# Patient Record
Sex: Female | Born: 1980 | Hispanic: Yes | Marital: Single | Smoking: Never smoker
Health system: Southern US, Community
[De-identification: ages and names within clinical notes are randomized; demographics above are authoritative.]

## PROBLEM LIST (undated history)

## (undated) DIAGNOSIS — Z8719 Personal history of other diseases of the digestive system: Secondary | ICD-10-CM

## (undated) DIAGNOSIS — G43909 Migraine, unspecified, not intractable, without status migrainosus: Secondary | ICD-10-CM

## (undated) DIAGNOSIS — K029 Dental caries, unspecified: Secondary | ICD-10-CM

## (undated) DIAGNOSIS — I10 Essential (primary) hypertension: Secondary | ICD-10-CM

## (undated) DIAGNOSIS — I471 Supraventricular tachycardia, unspecified: Secondary | ICD-10-CM

## (undated) DIAGNOSIS — Z8632 Personal history of gestational diabetes: Secondary | ICD-10-CM

## (undated) DIAGNOSIS — I82409 Acute embolism and thrombosis of unspecified deep veins of unspecified lower extremity: Secondary | ICD-10-CM

## (undated) HISTORY — DX: Supraventricular tachycardia: I47.1

## (undated) HISTORY — DX: Personal history of other diseases of the digestive system: Z87.19

## (undated) HISTORY — DX: Acute embolism and thrombosis of unspecified deep veins of unspecified lower extremity: I82.409

## (undated) HISTORY — DX: Migraine, unspecified, not intractable, without status migrainosus: G43.909

## (undated) HISTORY — DX: Dental caries, unspecified: K02.9

## (undated) HISTORY — DX: Supraventricular tachycardia, unspecified: I47.10

## (undated) HISTORY — DX: Personal history of gestational diabetes: Z86.32

## (undated) HISTORY — PX: OTHER SURGICAL HISTORY: SHX169

## (undated) HISTORY — DX: Essential (primary) hypertension: I10

---

## 2004-05-15 ENCOUNTER — Ambulatory Visit: Payer: Self-pay | Admitting: Internal Medicine

## 2004-11-07 ENCOUNTER — Emergency Department (HOSPITAL_COMMUNITY): Admission: EM | Admit: 2004-11-07 | Discharge: 2004-11-07 | Payer: Self-pay | Admitting: Emergency Medicine

## 2005-04-24 ENCOUNTER — Observation Stay: Payer: Self-pay

## 2005-04-28 ENCOUNTER — Observation Stay: Payer: Self-pay

## 2005-04-30 ENCOUNTER — Observation Stay: Payer: Self-pay

## 2005-05-01 ENCOUNTER — Inpatient Hospital Stay: Payer: Self-pay

## 2006-10-23 ENCOUNTER — Inpatient Hospital Stay: Payer: Self-pay | Admitting: Obstetrics and Gynecology

## 2006-11-06 ENCOUNTER — Ambulatory Visit: Payer: Self-pay | Admitting: Obstetrics and Gynecology

## 2009-11-08 ENCOUNTER — Emergency Department (HOSPITAL_COMMUNITY): Admission: EM | Admit: 2009-11-08 | Discharge: 2009-11-08 | Payer: Self-pay | Admitting: Emergency Medicine

## 2010-10-04 ENCOUNTER — Emergency Department (HOSPITAL_COMMUNITY)
Admission: EM | Admit: 2010-10-04 | Discharge: 2010-10-04 | Payer: Self-pay | Source: Home / Self Care | Admitting: Emergency Medicine

## 2010-10-04 LAB — URINALYSIS, ROUTINE W REFLEX MICROSCOPIC
Bilirubin Urine: NEGATIVE
Nitrite: NEGATIVE
Protein, ur: NEGATIVE mg/dL
Specific Gravity, Urine: 1.01 (ref 1.005–1.030)
Urobilinogen, UA: 0.2 mg/dL (ref 0.0–1.0)

## 2010-10-04 LAB — BASIC METABOLIC PANEL
Chloride: 105 mEq/L (ref 96–112)
GFR calc non Af Amer: >60 mL/min (ref >60)
Glucose, Bld: 90 mg/dL (ref 70–99)

## 2010-10-04 LAB — DIFFERENTIAL
Basophils Relative: 0 % (ref 0–1)
Eosinophils Absolute: 0.1 10*3/uL (ref 0.0–0.7)
Eosinophils Relative: 1 % (ref 0–5)
Monocytes Absolute: 0.6 10*3/uL (ref 0.1–1.0)

## 2010-10-04 LAB — CBC
HCT: 36.7 % (ref 36.0–46.0)
Hemoglobin: 12.3 g/dL (ref 12.0–15.0)
MCH: 30.4 pg (ref 26.0–34.0)
MCHC: 33.5 g/dL (ref 30.0–36.0)
RBC: 4.04 MIL/uL (ref 3.87–5.11)

## 2010-10-04 LAB — WET PREP, GENITAL: Yeast Wet Prep HPF POC: NONE SEEN

## 2010-10-04 LAB — POCT PREGNANCY, URINE: Preg Test, Ur: POSITIVE

## 2010-10-04 LAB — URINE MICROSCOPIC-ADD ON

## 2010-10-04 LAB — ABO/RH: ABO/RH(D): O POS

## 2010-10-04 LAB — HCG, QUANTITATIVE, PREGNANCY: hCG, Beta Chain, Quant, S: 5529 m[IU]/mL — ABNORMAL HIGH (ref <5)

## 2010-10-05 ENCOUNTER — Inpatient Hospital Stay (HOSPITAL_COMMUNITY)
Admission: AD | Admit: 2010-10-05 | Discharge: 2010-10-05 | Payer: Self-pay | Source: Home / Self Care | Attending: Obstetrics & Gynecology | Admitting: Obstetrics & Gynecology

## 2010-10-06 ENCOUNTER — Inpatient Hospital Stay (HOSPITAL_COMMUNITY)
Admission: AD | Admit: 2010-10-06 | Discharge: 2010-10-06 | Payer: Self-pay | Source: Home / Self Care | Attending: Obstetrics & Gynecology | Admitting: Obstetrics & Gynecology

## 2010-10-06 LAB — GC/CHLAMYDIA PROBE AMP, GENITAL: GC Probe Amp, Genital: NEGATIVE

## 2010-10-23 ENCOUNTER — Encounter: Payer: Self-pay | Admitting: Obstetrics and Gynecology

## 2010-12-01 LAB — URINALYSIS, ROUTINE W REFLEX MICROSCOPIC
Bilirubin Urine: NEGATIVE
Glucose, UA: NEGATIVE mg/dL
Hgb urine dipstick: NEGATIVE
Specific Gravity, Urine: 1.013 (ref 1.005–1.030)
pH: 7.5 (ref 5.0–8.0)

## 2010-12-01 LAB — URINE MICROSCOPIC-ADD ON

## 2011-08-03 ENCOUNTER — Encounter: Payer: Self-pay | Admitting: Maternal and Fetal Medicine

## 2011-09-07 ENCOUNTER — Ambulatory Visit: Payer: Self-pay | Admitting: Advanced Practice Midwife

## 2011-09-08 NOTE — L&D Delivery Note (Signed)
Delivery Note At 2:39 AM a viable and healthy female was delivered via Vaginal, Spontaneous Delivery (Presentation: ;  ).  APGAR: 3, 9; weight 8 lb 1.8 oz (3680 g).   Placenta status: Intact, Spontaneous.  Cord: 3 vessels with the following complications: .  Cord pH: 7.20  Anesthesia: None  Episiotomy: None Lacerations: 2nd degree;Perineal Suture Repair: 2.0 Est. Blood Loss (mL): 450  Code Apgar Called, positive pressure ventilation X 45 seconds.  Improved.  NICU arrived and attended to baby   Mom to postpartum.  Baby to nursery-stable.  [name], Joy 02/19/2012, 3:28 AM    I precepted this delivery.  I agree with the above note.  Levie Heritage, DO 02/19/2012 4:49 AM

## 2012-01-25 IMAGING — US US OB NUCHAL TRANSLUCENCY 1ST GEST - [facility] NRPT
1 series · 14 of 28 positions shown · non-contrast
Comparison: none

[Series 1: us ob nuchal translucency 1st gest - [facility] nrpt · 14 of 38 slices shown]
[im 2/38]
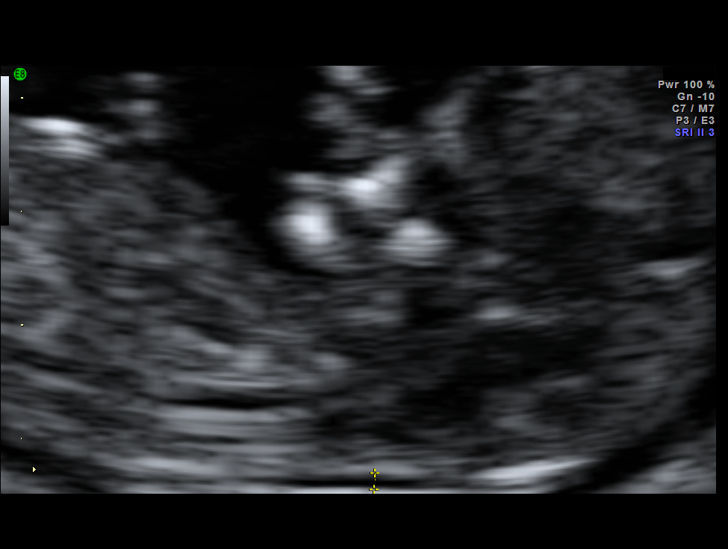
[im 5/38]
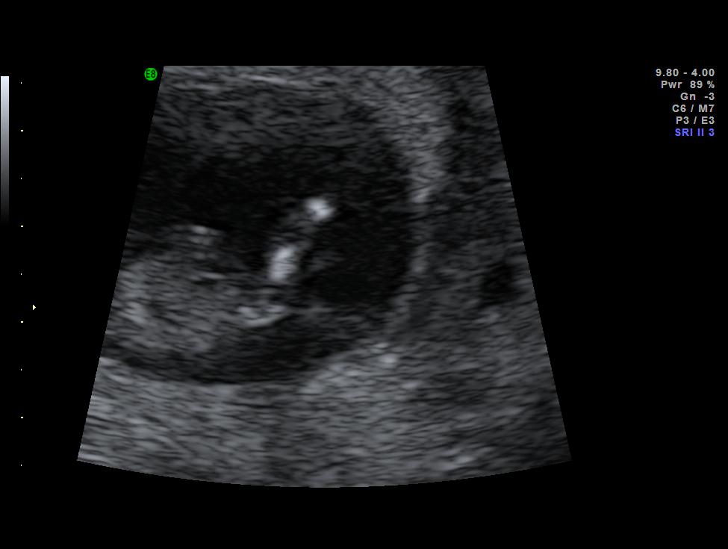
[im 7/38]
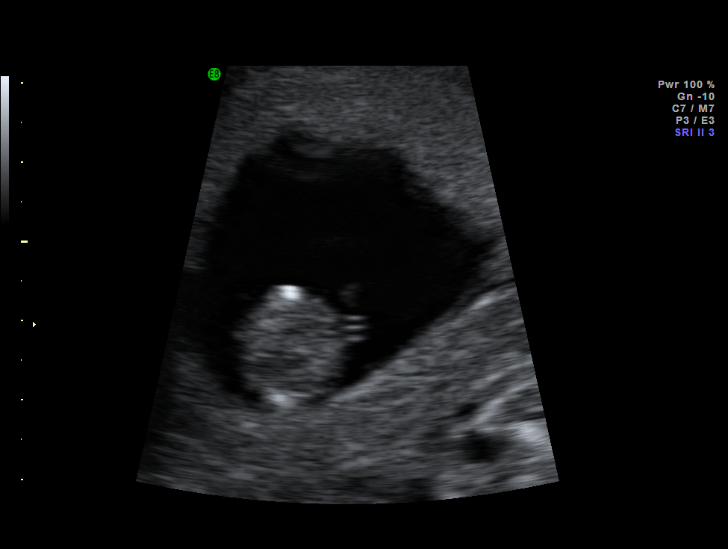
[im 10/38]
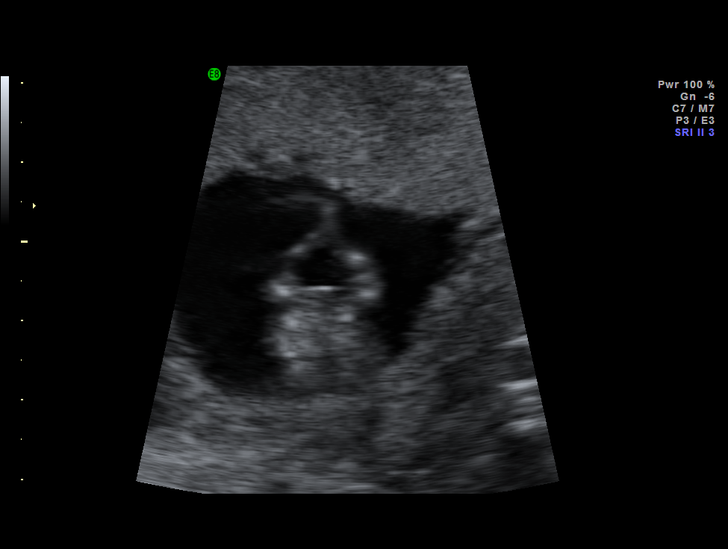
[im 13/38]
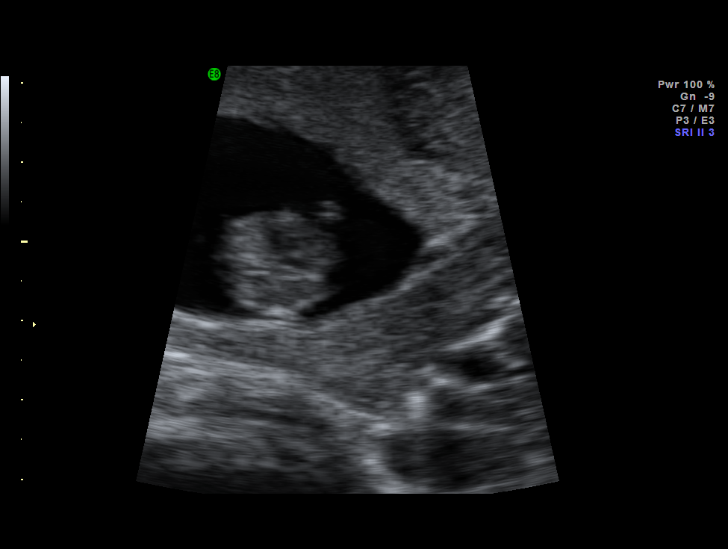
[im 16/38]
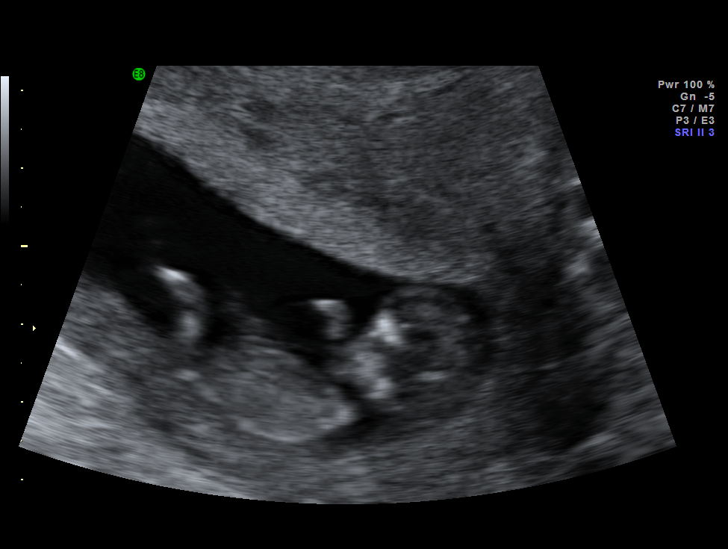
[im 18/38]
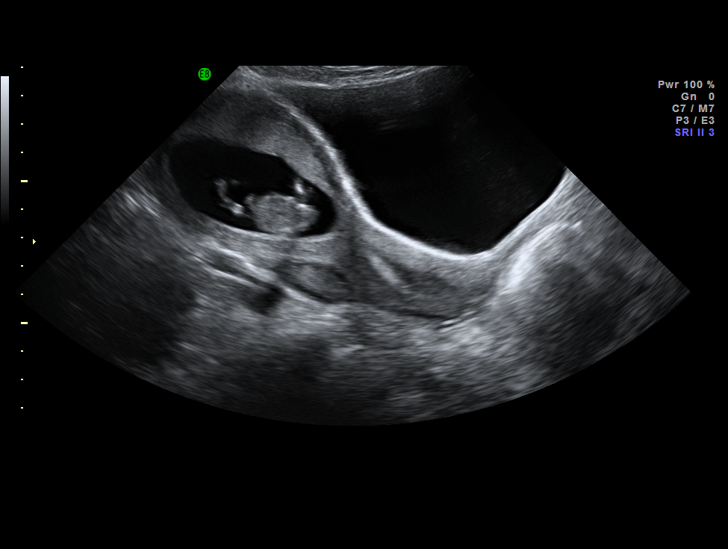
[im 21/38]
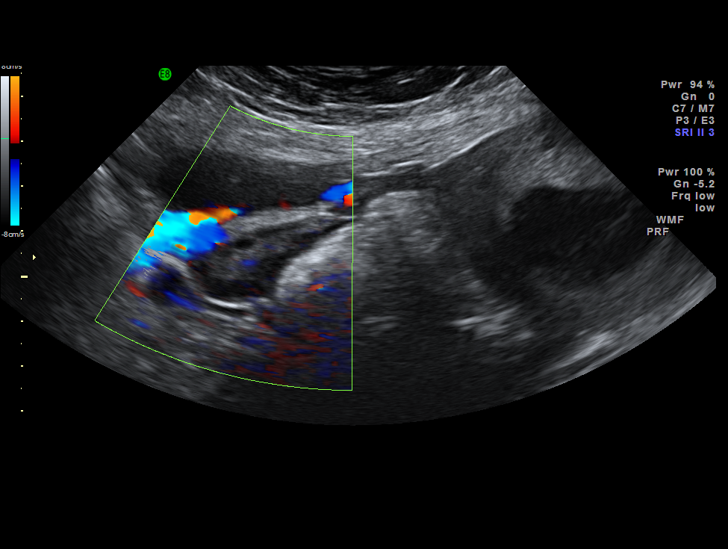
[im 24/38]
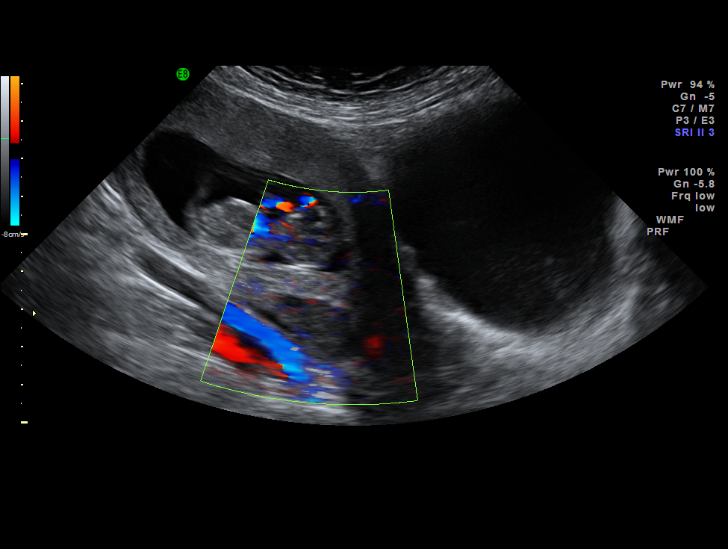
[im 27/38]
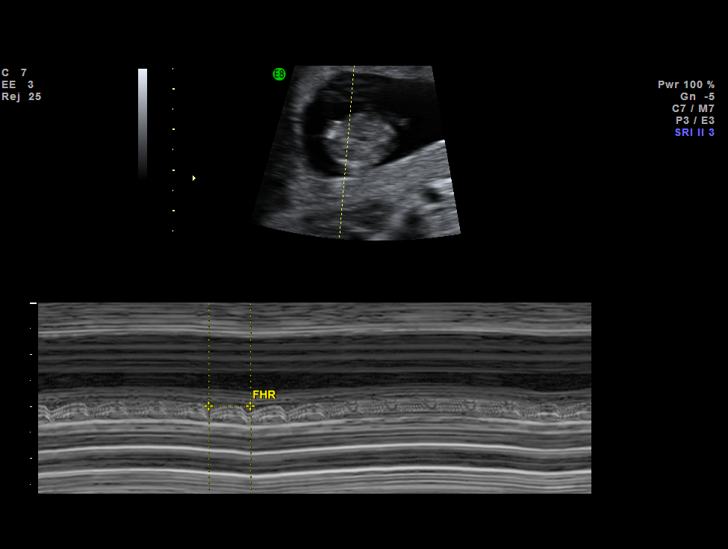
[im 29/38]
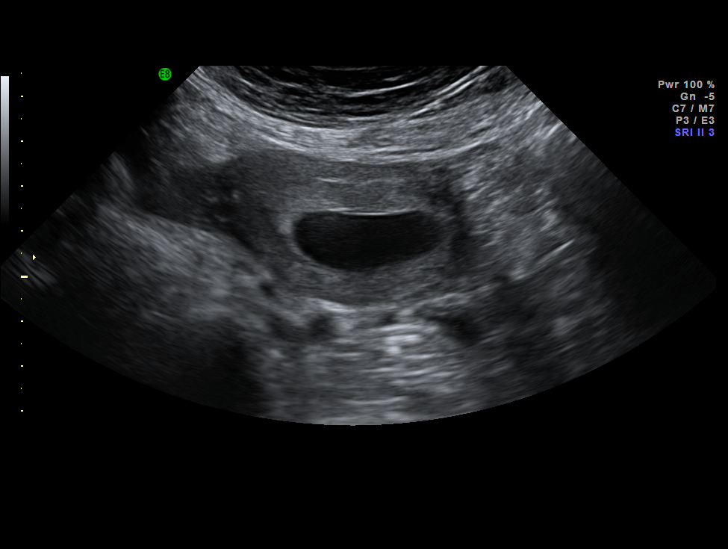
[im 32/38]
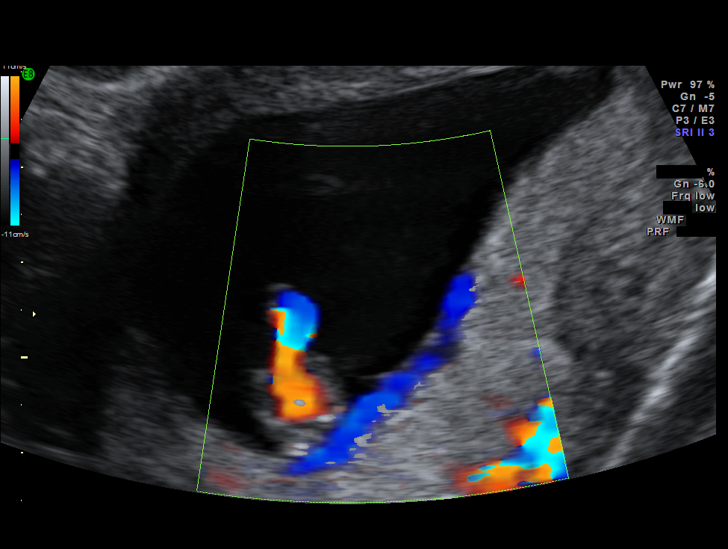
[im 35/38]
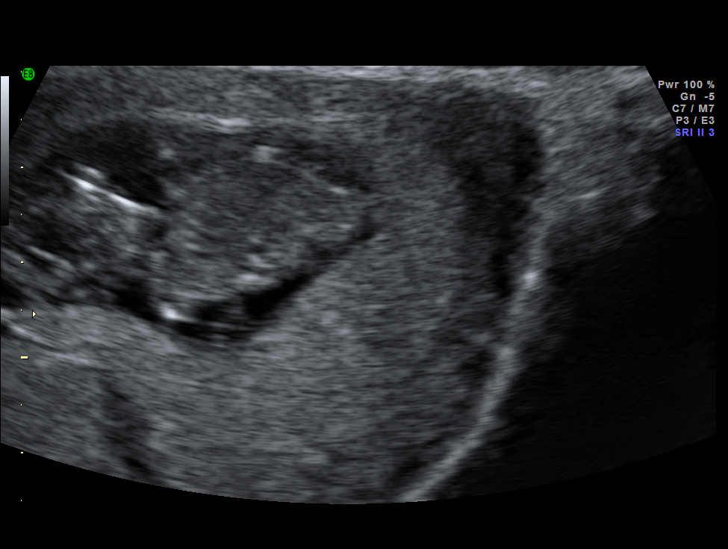
[im 38/38]
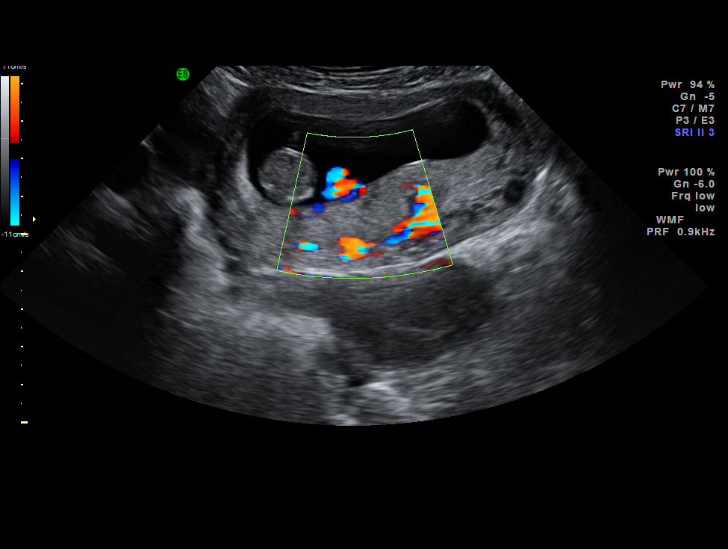

[14 of 28 positions shown; findings below may reference images not displayed]

IMAGES IMPORTED FROM THE [employer]NGO WORKFLOW SYSTEM
NO DICTATION FOR STUDY

## 2012-02-18 ENCOUNTER — Encounter (HOSPITAL_COMMUNITY): Payer: Self-pay

## 2012-02-18 ENCOUNTER — Inpatient Hospital Stay (HOSPITAL_COMMUNITY)
Admission: AD | Admit: 2012-02-18 | Discharge: 2012-02-21 | DRG: 775 | Disposition: A | Discharge status: Home / Self Care | Payer: Medicaid Other | Source: Ambulatory Visit | Attending: Obstetrics & Gynecology | Admitting: Obstetrics & Gynecology

## 2012-02-18 DIAGNOSIS — Z22330 Carrier of Group B streptococcus: Secondary | ICD-10-CM

## 2012-02-18 DIAGNOSIS — O4290 Premature rupture of membranes, unspecified as to length of time between rupture and onset of labor, unspecified weeks of gestation: Secondary | ICD-10-CM | POA: Diagnosis present

## 2012-02-18 DIAGNOSIS — O99892 Other specified diseases and conditions complicating childbirth: Secondary | ICD-10-CM | POA: Diagnosis present

## 2012-02-18 LAB — RUBELLA SCREEN: Rubella: 61.6 IU/mL — ABNORMAL HIGH

## 2012-02-18 LAB — CBC
HCT: 35.8 % — ABNORMAL LOW (ref 36.0–46.0)
Hemoglobin: 12.1 g/dL (ref 12.0–15.0)
MCH: 31.8 pg (ref 26.0–34.0)
MCHC: 33.8 g/dL (ref 30.0–36.0)
RDW: 14.4 % (ref 11.5–15.5)

## 2012-02-18 LAB — ABO/RH: ABO/RH(D): O POS

## 2012-02-18 MED ORDER — PENICILLIN G POTASSIUM 5000000 UNITS IJ SOLR
2.5000 10*6.[IU] | INTRAVENOUS | Status: DC
  Administered 2012-02-18 (×2): 2.5 10*6.[IU] via INTRAVENOUS
  Filled 2012-02-18 (×7): qty 2.5

## 2012-02-18 MED ORDER — OXYTOCIN 20 UNITS IN LACTATED RINGERS INFUSION - SIMPLE
125.0000 mL/h | Freq: Once | INTRAVENOUS | Status: AC
  Administered 2012-02-19: 125 mL/h via INTRAVENOUS

## 2012-02-18 MED ORDER — OXYTOCIN BOLUS FROM INFUSION
500.0000 mL | Freq: Once | INTRAVENOUS | Status: DC
  Filled 2012-02-18: qty 500

## 2012-02-18 MED ORDER — MISOPROSTOL 25 MCG QUARTER TABLET
25.0000 ug | ORAL_TABLET | ORAL | Status: DC | PRN
  Administered 2012-02-18: 25 ug via VAGINAL
  Filled 2012-02-18: qty 0.25

## 2012-02-18 MED ORDER — NALBUPHINE SYRINGE 5 MG/0.5 ML
10.0000 mg | INJECTION | INTRAMUSCULAR | Status: DC | PRN
  Administered 2012-02-18 – 2012-02-19 (×2): 10 mg via INTRAVENOUS
  Filled 2012-02-18 (×3): qty 1

## 2012-02-18 MED ORDER — IBUPROFEN 600 MG PO TABS
600.0000 mg | ORAL_TABLET | Freq: Four times a day (QID) | ORAL | Status: DC | PRN
  Administered 2012-02-19: 600 mg via ORAL
  Filled 2012-02-18: qty 1

## 2012-02-18 MED ORDER — LACTATED RINGERS IV SOLN
INTRAVENOUS | Status: DC
  Administered 2012-02-18: 125 mL via INTRAVENOUS

## 2012-02-18 MED ORDER — OXYTOCIN 20 UNITS IN LACTATED RINGERS INFUSION - SIMPLE
1.0000 m[IU]/min | INTRAVENOUS | Status: DC
  Administered 2012-02-18: 2 m[IU]/min via INTRAVENOUS
  Administered 2012-02-18: 6 m[IU]/min via INTRAVENOUS
  Filled 2012-02-18: qty 1000

## 2012-02-18 MED ORDER — ONDANSETRON HCL 4 MG/2ML IJ SOLN: 4.0000 mg | Freq: Four times a day (QID) | INTRAMUSCULAR | Status: DC | PRN

## 2012-02-18 MED ORDER — LACTATED RINGERS IV SOLN: 500.0000 mL | INTRAVENOUS | Status: DC | PRN

## 2012-02-18 MED ORDER — FLEET ENEMA 7-19 GM/118ML RE ENEM: 1.0000 | ENEMA | RECTAL | Status: DC | PRN

## 2012-02-18 MED ORDER — ACETAMINOPHEN 325 MG PO TABS: 650.0000 mg | ORAL_TABLET | ORAL | Status: DC | PRN

## 2012-02-18 MED ORDER — LIDOCAINE HCL (PF) 1 % IJ SOLN
30.0000 mL | INTRAMUSCULAR | Status: DC | PRN
  Administered 2012-02-19: 30 mL via SUBCUTANEOUS
  Filled 2012-02-18: qty 30

## 2012-02-18 MED ORDER — OXYCODONE-ACETAMINOPHEN 5-325 MG PO TABS: 1.0000 | ORAL_TABLET | ORAL | Status: DC | PRN

## 2012-02-18 MED ORDER — CITRIC ACID-SODIUM CITRATE 334-500 MG/5ML PO SOLN: 30.0000 mL | ORAL | Status: DC | PRN

## 2012-02-18 MED ORDER — TERBUTALINE SULFATE 1 MG/ML IJ SOLN: 0.2500 mg | Freq: Once | INTRAMUSCULAR | Status: AC | PRN

## 2012-02-18 MED ORDER — PENICILLIN G POTASSIUM 5000000 UNITS IJ SOLR
5.0000 10*6.[IU] | INTRAVENOUS | Status: AC
  Administered 2012-02-18: 5 10*6.[IU] via INTRAVENOUS
  Filled 2012-02-18: qty 5

## 2012-02-18 NOTE — Progress Notes (Signed)
Dr. Lahoma Rocker is going to discuss POC with Dr. Adrian Blackwater.

## 2012-02-18 NOTE — Progress Notes (Signed)
[facility] Department of Clinical Social Work Documentation of Interpretation   I assisted _Christy   RN__________________ with interpretation of __admission____________________ for this patient.

## 2012-02-18 NOTE — Progress Notes (Signed)
Interpretor at bedside. 

## 2012-02-18 NOTE — H&P (Signed)
Joy [name] is a 31 y.o. female presenting for ROM.  She presents to the hospital because she broke her water.  She was concerned because it was coffee colored and it had never happened to her.  It was like dark coffee.  No bleeding, she has started to have contractions now that her water has broken.  No current nausea, vomiting.  No fever, chills, sweats.   No illness.  Does have a history of a recent UTI treated with an unknown antibiotic.  By report, her ultrasound of her baby was normal  History OB History    Grav Para Term Preterm Abortions TAB SAB Ect Mult Living   4 2 2  0 1 0 1 0 0 2     History reviewed. No pertinent past medical history. History reviewed. No pertinent past surgical history. Family History: family history is not on file. Social History:  reports that she has never smoked. She does not have any smokeless tobacco history on file. She reports that she does not drink alcohol or use illicit drugs.  ROS Negative except per HPI   Blood pressure 122/82, pulse 75, temperature 98.2 F (36.8 C), temperature source Oral, last menstrual period 05/12/2011. Exam Physical Exam  Constitutional: She is oriented to person, place, and time. She appears well-developed and well-nourished. No distress.  HENT:  Head: Normocephalic and atraumatic.  Eyes: Conjunctivae and EOM are normal.  Neck: No tracheal deviation present.  Cardiovascular: Normal rate, regular rhythm and normal heart sounds.   Respiratory: Effort normal and breath sounds normal. No stridor.  GI: Soft. She exhibits no distension. There is no tenderness. There is no rebound and no guarding.  Genitourinary:       Cervix 2cm/50%/soft/posterior/-3, bishop score 4, modified bishop score 6. Thick meconium in vaginal vault. Presenting part unclear by palpation On ultrasound appeared to be shoulder with head well to the right of midline.  Repeat ultrasound showed vertex presentation.  Neurological: She is  alert and oriented to person, place, and time.  Skin: Skin is warm and dry. She is not diaphoretic.  Psychiatric: She has a normal mood and affect. Her behavior is normal. Judgment and thought content normal.    Continuous electronic fetal monitoring:  Baseline 150, minimal variability with periods of moderate variability, accelerations present, some variable decels.  Prenatal labs: ABO, Rh:   Antibody:   Rubella:   RPR:    HBsAg:    HIV:    GBS: Positive (05/27 0000)   Awaiting health department records  Assessment/Plan: 31 year old female with PROM and thick meconium Initially shoulder presenting, now spontaneously vertex. Bishop score 4, modified bishop score 6 - will begin inducing with cytotec. GBS +, starting PCN Other labs pending.   Clancy Gourd 02/18/2012, 1:21 PM

## 2012-02-18 NOTE — Progress Notes (Signed)
  Subjective: Patient feeling moderate contractions  Objective: BP 139/79  Pulse 83  Temp 97.5 F (36.4 C) (Oral)  Resp 18  LMP 05/12/2011      FHT:  FHR: 135 bpm, variability: moderate,  accelerations:  Present,  decelerations:  Absent UC:   regular, every 3 minutes SVE:   Dilation:  (foley bulb in place) Effacement (%): 50 Station: -3 Exam by:: dr. Adrian Blackwater  Labs: Lab Results  Component Value Date   WBC 7.0 02/18/2012   HGB 12.1 02/18/2012   HCT 35.8* 02/18/2012   MCV 94.2 02/18/2012   PLT 163 02/18/2012    Assessment / Plan: Category 1 tracing.  Foley balloon still in place.  Continue with current management.  Joy [name] JEHIEL 02/18/2012, 11:18 PM

## 2012-02-18 NOTE — H&P (Signed)
Patient seen and examined.  Agree with above note.  Levie Heritage, DO 02/18/2012 3:24 PM

## 2012-02-19 ENCOUNTER — Encounter (HOSPITAL_COMMUNITY): Payer: Self-pay | Admitting: *Deleted

## 2012-02-19 DIAGNOSIS — O4290 Premature rupture of membranes, unspecified as to length of time between rupture and onset of labor, unspecified weeks of gestation: Secondary | ICD-10-CM

## 2012-02-19 DIAGNOSIS — O9989 Other specified diseases and conditions complicating pregnancy, childbirth and the puerperium: Secondary | ICD-10-CM

## 2012-02-19 MED ORDER — OXYCODONE-ACETAMINOPHEN 5-325 MG PO TABS
1.0000 | ORAL_TABLET | ORAL | Status: DC | PRN
  Administered 2012-02-19 – 2012-02-20 (×2): 1 via ORAL
  Filled 2012-02-19 (×2): qty 1

## 2012-02-19 MED ORDER — ONDANSETRON HCL 4 MG PO TABS: 4.0000 mg | ORAL_TABLET | ORAL | Status: DC | PRN

## 2012-02-19 MED ORDER — ZOLPIDEM TARTRATE 5 MG PO TABS: 5.0000 mg | ORAL_TABLET | Freq: Every evening | ORAL | Status: DC | PRN

## 2012-02-19 MED ORDER — TETANUS-DIPHTH-ACELL PERTUSSIS 5-2.5-18.5 LF-MCG/0.5 IM SUSP
0.5000 mL | Freq: Once | INTRAMUSCULAR | Status: AC
  Administered 2012-02-21: 0.5 mL via INTRAMUSCULAR
  Filled 2012-02-19: qty 0.5

## 2012-02-19 MED ORDER — SIMETHICONE 80 MG PO CHEW: 80.0000 mg | CHEWABLE_TABLET | ORAL | Status: DC | PRN

## 2012-02-19 MED ORDER — SENNOSIDES-DOCUSATE SODIUM 8.6-50 MG PO TABS
2.0000 | ORAL_TABLET | Freq: Every day | ORAL | Status: DC
  Administered 2012-02-19 – 2012-02-20 (×2): 2 via ORAL

## 2012-02-19 MED ORDER — DIPHENHYDRAMINE HCL 25 MG PO CAPS: 25.0000 mg | ORAL_CAPSULE | Freq: Four times a day (QID) | ORAL | Status: DC | PRN

## 2012-02-19 MED ORDER — BENZOCAINE-MENTHOL 20-0.5 % EX AERO
1.0000 "application " | INHALATION_SPRAY | CUTANEOUS | Status: DC | PRN
  Administered 2012-02-20: 1 via TOPICAL
  Filled 2012-02-19: qty 56

## 2012-02-19 MED ORDER — DIBUCAINE 1 % RE OINT: 1.0000 "application " | TOPICAL_OINTMENT | RECTAL | Status: DC | PRN

## 2012-02-19 MED ORDER — LANOLIN HYDROUS EX OINT: TOPICAL_OINTMENT | CUTANEOUS | Status: DC | PRN

## 2012-02-19 MED ORDER — PRENATAL MULTIVITAMIN CH
1.0000 | ORAL_TABLET | Freq: Every day | ORAL | Status: DC
  Administered 2012-02-19 – 2012-02-21 (×3): 1 via ORAL
  Filled 2012-02-19 (×3): qty 1

## 2012-02-19 MED ORDER — WITCH HAZEL-GLYCERIN EX PADS: 1.0000 "application " | MEDICATED_PAD | CUTANEOUS | Status: DC | PRN

## 2012-02-19 MED ORDER — IBUPROFEN 600 MG PO TABS
600.0000 mg | ORAL_TABLET | Freq: Four times a day (QID) | ORAL | Status: DC
  Administered 2012-02-19 – 2012-02-21 (×9): 600 mg via ORAL
  Filled 2012-02-19 (×10): qty 1

## 2012-02-19 MED ORDER — MISOPROSTOL 200 MCG PO TABS
ORAL_TABLET | ORAL | Status: AC
  Administered 2012-02-19: 1000 ug via RECTAL
  Filled 2012-02-19: qty 5

## 2012-02-19 MED ORDER — ONDANSETRON HCL 4 MG/2ML IJ SOLN: 4.0000 mg | INTRAMUSCULAR | Status: DC | PRN

## 2012-02-19 NOTE — Progress Notes (Signed)
UR chart review completed.  

## 2012-02-20 LAB — CBC
Hemoglobin: 10.9 g/dL — ABNORMAL LOW (ref 12.0–15.0)
MCH: 31.5 pg (ref 26.0–34.0)
MCHC: 33.4 g/dL (ref 30.0–36.0)
Platelets: 149 10*3/uL — ABNORMAL LOW (ref 150–400)

## 2012-02-20 MED ORDER — IBUPROFEN 600 MG PO TABS: 600.0000 mg | ORAL_TABLET | Freq: Four times a day (QID) | ORAL | Status: AC

## 2012-02-20 NOTE — Discharge Instructions (Signed)
Parto vaginal °Cuidados luego °(Vaginal Delivery, Care After) °· Cambie el apósito cada vez que vaya al baño.  °· Límpiese suavemente con un papel tisú durante su estadía en el hospital, y siempre hágalo desde adelante hacia atrás. Puede utilizar un rociador con agua caliente del grifo o el tisú descartable, o ambos.  °· Coloque el apósito y el tisú sucios en el cesto de basura del baño, en la bolsa plástica de color rojo.  °· Durante su permanencia en el hospital, guarde los coágulos. Si elimina un coágulo, por favor no tire la cadena. También, si su flujo vaginal le parece excesivo, notifíquelo al personal de enfermería.  °· La primera vez que se levante de la cama después del parto, espere la ayuda de la enfermera. No se levante sola en ningún momento si se siente débil o mareada.  °· Flexione y extienda los tobillos vigorosamente, de modo que pueda sentir que las pantorrillas se endurecen, media docena de veces por hora, cuando está en la cama y despierta.  °· No se siente sobre un pie suyo ni deje las piernas colgando del borde de la cama ni mantenga una posición que dificulte la circulación de las piernas.  °· Muchas mujeres experimentan dolores de entuerto (contracciones uterinas leves) en los dos o tres días después del parto. Pídale a la enfermera un medicamento contra el dolor si lo necesita. Algunas veces la alimentación a pecho estimula los "entuertos"; si le ocurre esto, pida la medicación ½ - ¾ de hora antes de la próxima vez que alimente al bebé.  °· Para su protección y la de su bebé, le pedimos que no traspase las puertas dobles de la entrada de la unidad de obstetricia. No lleve al bebé en brazos por el corredor. Cuando saque o entre al bebé de la habitación, por favor colóquelo en el cochecito y empújelo.  °· Las mamás pueden tener a sus bebés en la habitación todo lo que deseen.  °Document Released: 08/24/2005 Document Revised: 08/13/2011 °ExitCare® Patient Information ©2012 [employer]. °

## 2012-02-20 NOTE — Discharge Summary (Signed)
Obstetric Discharge Summary Reason for Admission: onset of labor Prenatal Procedures: none Intrapartum Procedures: spontaneous vaginal delivery Postpartum Procedures: none Complications-Operative and Postpartum: 2nd degree perineal laceration Hemoglobin  Date Value Range Status  02/20/2012 10.9* 12.0 - 15.0 g/dL Final     HCT  Date Value Range Status  02/20/2012 32.6* 36.0 - 46.0 % Final    Physical Exam:  General: alert, cooperative, appears stated age and no distress Lochia: appropriate Uterine Fundus: firm Incision: n/a DVT Evaluation: No evidence of DVT seen on physical exam. Negative Homan's sign. No cords or calf tenderness. No significant calf/ankle edema.  Discharge Diagnoses: Term Pregnancy-delivered  Discharge Information: Date: 02/20/2012 Activity: pelvic rest Diet: routine Medications: Ibuprofen Condition: stable Instructions: refer to practice specific booklet Discharge to: home Follow-up Information    Follow up in 6 weeks. (Your health department)          Newborn Data: Live born female  Birth Weight: 8 lb 1.8 oz (3680 g) APGAR: 3, 9  Home with mother.  Andrena Mews, DO [facility] Resident - PGY-1 02/20/2012 6:51 AM

## 2012-02-20 NOTE — Progress Notes (Signed)
I have seen and examined Joy [name]. I agree with Dr. Janeece Riggers assessment and plan.

## 2012-02-21 NOTE — Discharge Summary (Signed)
Medical Screening exam and patient care preformed by advanced practice provider.  Agree with the above management.  

## 2012-02-21 NOTE — Discharge Summary (Signed)
Obstetric Discharge Summary  Pt stayed additional 24 hours due to continued neonatal monitoring; no changes overnight; no new complaints.  Reason for Admission: onset of labor Prenatal Procedures: none Intrapartum Procedures: spontaneous vaginal delivery Postpartum Procedures: none Complications-Operative and Postpartum: 2nd degree perineal laceration Hemoglobin  Date Value Range Status  02/20/2012 10.9* 12.0 - 15.0 g/dL Final     HCT  Date Value Range Status  02/20/2012 32.6* 36.0 - 46.0 % Final    Physical Exam:  General: alert, cooperative, appears stated age and no distress Lochia: appropriate Uterine Fundus: firm Incision: n/a DVT Evaluation: No evidence of DVT seen on physical exam. Negative Homan's sign. No cords or calf tenderness. No significant calf/ankle edema.  Discharge Diagnoses: Term Pregnancy-delivered  Discharge Information: Date: 02/21/2012 Activity: pelvic rest Diet: routine Medications: Ibuprofen Condition: stable Instructions: refer to practice specific booklet Discharge to: home Follow-up Information    Follow up in 6 weeks. (Your health department)         Newborn Data: Live born female  Birth Weight: 8 lb 1.8 oz (3680 g) APGAR: 3, 9  Home with mother.  Andrena Mews, DO [facility] Resident - PGY-1 02/21/2012 7:49 AM   Saw pt and agree. (Dr. Manson Passey here for interpretation) Lafe Clerk 02/21/2012 9:21 AM

## 2014-07-09 ENCOUNTER — Encounter (HOSPITAL_COMMUNITY): Payer: Self-pay | Admitting: *Deleted

## 2014-08-09 LAB — OB RESULTS CONSOLE HIV ANTIBODY (ROUTINE TESTING): HIV: NONREACTIVE

## 2014-08-09 LAB — OB RESULTS CONSOLE HEPATITIS B SURFACE ANTIGEN: Hepatitis B Surface Ag: NEGATIVE

## 2014-08-09 LAB — HM PAP SMEAR: HM Pap smear: NEGATIVE

## 2014-08-09 LAB — OB RESULTS CONSOLE RPR: RPR: NONREACTIVE

## 2014-08-12 LAB — OB RESULTS CONSOLE GC/CHLAMYDIA
CHLAMYDIA, DNA PROBE: NEGATIVE
Gonorrhea: NEGATIVE

## 2014-09-03 ENCOUNTER — Encounter: Payer: Self-pay | Admitting: Obstetrics and Gynecology

## 2014-09-07 NOTE — L&D Delivery Note (Cosign Needed)
  Delivery Note Pt pushed well and at 3:28 AM a viable female was delivered via Vaginal, Spontaneous Delivery (Presentation: ; Occiput Posterior).  APGAR: 8, 9; weight: pending. Infant dried and lifted to pt's abd. Cord clamped and cut by FOB. Hospital cord blood sample collected. Placenta status: Intact, Spontaneous.  Cord: 3 vessels   Anesthesia: None  Episiotomy: None Lacerations: None Est. Blood Loss (mL): 425  Mom to postpartum.  Baby to Couplet care / Skin to Skin.  Cam HaiSHAW, KIMBERLY CNM 03/14/2015, 4:19 AM

## 2014-10-04 ENCOUNTER — Encounter: Payer: Self-pay | Admitting: Obstetrics and Gynecology

## 2014-10-08 ENCOUNTER — Encounter: Payer: Self-pay | Admitting: Obstetrics and Gynecology

## 2015-03-13 ENCOUNTER — Inpatient Hospital Stay (HOSPITAL_COMMUNITY)
Admission: AD | Admit: 2015-03-13 | Discharge: 2015-03-15 | DRG: 775 | Disposition: A | Discharge status: Home / Self Care | Payer: Medicaid Other | Source: Ambulatory Visit | Attending: Obstetrics & Gynecology | Admitting: Obstetrics & Gynecology

## 2015-03-13 ENCOUNTER — Encounter (HOSPITAL_COMMUNITY): Payer: Self-pay | Admitting: *Deleted

## 2015-03-13 DIAGNOSIS — O99824 Streptococcus B carrier state complicating childbirth: Principal | ICD-10-CM | POA: Diagnosis present

## 2015-03-13 DIAGNOSIS — R531 Weakness: Secondary | ICD-10-CM | POA: Diagnosis not present

## 2015-03-13 DIAGNOSIS — R200 Anesthesia of skin: Secondary | ICD-10-CM | POA: Diagnosis not present

## 2015-03-13 DIAGNOSIS — Z3A39 39 weeks gestation of pregnancy: Secondary | ICD-10-CM | POA: Diagnosis present

## 2015-03-13 DIAGNOSIS — IMO0001 Reserved for inherently not codable concepts without codable children: Secondary | ICD-10-CM

## 2015-03-13 DIAGNOSIS — O09523 Supervision of elderly multigravida, third trimester: Secondary | ICD-10-CM | POA: Diagnosis not present

## 2015-03-13 LAB — CBC
HCT: 38.4 % (ref 36.0–46.0)
Hemoglobin: 13.3 g/dL (ref 12.0–15.0)
MCH: 32.4 pg (ref 26.0–34.0)
MCHC: 34.6 g/dL (ref 30.0–36.0)
MCV: 93.7 fL (ref 78.0–100.0)
PLATELETS: 154 10*3/uL (ref 150–400)
RBC: 4.1 MIL/uL (ref 3.87–5.11)
RDW: 14.2 % (ref 11.5–15.5)
WBC: 9 10*3/uL (ref 4.0–10.5)

## 2015-03-13 LAB — OB RESULTS CONSOLE GBS: GBS: POSITIVE

## 2015-03-13 MED ORDER — OXYTOCIN BOLUS FROM INFUSION
500.0000 mL | INTRAVENOUS | Status: DC
  Administered 2015-03-14: 500 mL via INTRAVENOUS

## 2015-03-13 MED ORDER — PENICILLIN G POTASSIUM 5000000 UNITS IJ SOLR
5.0000 10*6.[IU] | Freq: Once | INTRAVENOUS | Status: AC
  Administered 2015-03-13: 5 10*6.[IU] via INTRAVENOUS
  Filled 2015-03-13: qty 5

## 2015-03-13 MED ORDER — CITRIC ACID-SODIUM CITRATE 334-500 MG/5ML PO SOLN: 30.0000 mL | ORAL | Status: DC | PRN

## 2015-03-13 MED ORDER — PHENYLEPHRINE 40 MCG/ML (10ML) SYRINGE FOR IV PUSH (FOR BLOOD PRESSURE SUPPORT)
80.0000 ug | PREFILLED_SYRINGE | INTRAVENOUS | Status: DC | PRN
  Filled 2015-03-13: qty 2

## 2015-03-13 MED ORDER — ONDANSETRON HCL 4 MG/2ML IJ SOLN: 4.0000 mg | Freq: Four times a day (QID) | INTRAMUSCULAR | Status: DC | PRN

## 2015-03-13 MED ORDER — LIDOCAINE HCL (PF) 1 % IJ SOLN
30.0000 mL | INTRAMUSCULAR | Status: DC | PRN
Start: 2015-03-13 — End: 2015-03-14
  Filled 2015-03-13: qty 30

## 2015-03-13 MED ORDER — OXYCODONE-ACETAMINOPHEN 5-325 MG PO TABS: 2.0000 | ORAL_TABLET | ORAL | Status: DC | PRN

## 2015-03-13 MED ORDER — PENICILLIN G POTASSIUM 5000000 UNITS IJ SOLR: 5.0000 10*6.[IU] | Freq: Once | INTRAMUSCULAR | Status: DC

## 2015-03-13 MED ORDER — FENTANYL CITRATE (PF) 100 MCG/2ML IJ SOLN
100.0000 ug | INTRAMUSCULAR | Status: DC | PRN
  Administered 2015-03-13 – 2015-03-14 (×4): 100 ug via INTRAVENOUS
  Filled 2015-03-13 (×5): qty 2

## 2015-03-13 MED ORDER — OXYTOCIN 40 UNITS IN LACTATED RINGERS INFUSION - SIMPLE MED
62.5000 mL/h | INTRAVENOUS | Status: DC
  Administered 2015-03-14: 62.5 mL/h via INTRAVENOUS
  Filled 2015-03-13: qty 1000

## 2015-03-13 MED ORDER — OXYCODONE-ACETAMINOPHEN 5-325 MG PO TABS
1.0000 | ORAL_TABLET | ORAL | Status: DC | PRN
  Administered 2015-03-14: 1 via ORAL
  Filled 2015-03-13: qty 1

## 2015-03-13 MED ORDER — DIPHENHYDRAMINE HCL 50 MG/ML IJ SOLN: 12.5000 mg | INTRAMUSCULAR | Status: DC | PRN

## 2015-03-13 MED ORDER — LACTATED RINGERS IV SOLN: 500.0000 mL | INTRAVENOUS | Status: DC | PRN

## 2015-03-13 MED ORDER — PENICILLIN G POTASSIUM 5000000 UNITS IJ SOLR: 2.5000 10*6.[IU] | INTRAVENOUS | Status: DC

## 2015-03-13 MED ORDER — LACTATED RINGERS IV SOLN
INTRAVENOUS | Status: DC
  Administered 2015-03-13: 12:00:00 via INTRAVENOUS

## 2015-03-13 MED ORDER — PENICILLIN G POTASSIUM 5000000 UNITS IJ SOLR
2.5000 10*6.[IU] | INTRAMUSCULAR | Status: DC
  Administered 2015-03-13 (×2): 2.5 10*6.[IU] via INTRAVENOUS
  Filled 2015-03-13 (×6): qty 2.5

## 2015-03-13 MED ORDER — AMPICILLIN SODIUM 2 G IJ SOLR: 2.0000 g | Freq: Once | INTRAMUSCULAR | Status: DC

## 2015-03-13 MED ORDER — EPHEDRINE 5 MG/ML INJ
10.0000 mg | INTRAVENOUS | Status: DC | PRN
  Filled 2015-03-13: qty 2

## 2015-03-13 MED ORDER — TERBUTALINE SULFATE 1 MG/ML IJ SOLN
0.2500 mg | Freq: Once | INTRAMUSCULAR | Status: AC
  Administered 2015-03-13: 0.25 mg via SUBCUTANEOUS
  Filled 2015-03-13: qty 1

## 2015-03-13 MED ORDER — FENTANYL 2.5 MCG/ML BUPIVACAINE 1/10 % EPIDURAL INFUSION (WH - ANES): 14.0000 mL/h | INTRAMUSCULAR | Status: DC | PRN

## 2015-03-13 MED ORDER — ACETAMINOPHEN 325 MG PO TABS: 650.0000 mg | ORAL_TABLET | ORAL | Status: DC | PRN

## 2015-03-13 NOTE — H&P (Signed)
LABOR ADMISSION HISTORY AND PHYSICAL  Joy [name] is a 34 y.o. female [phone] with IUP at 4147w6d presenting for for labor evaluation and vaginal bleeding. She reports +FMs, No LOF, no VB, no blurry vision, headaches or peripheral edema, and RUQ pain.  She plans on breast feeding. She request mirena for birth control.  Dating: By LMP c/w 12w sono --->  Estimated Date of Delivery: 03/14/15   Prenatal History/Complications:  Past Medical History: No past medical history on file.  Past Surgical History: No past surgical history on file.  Obstetrical History: OB History    Gravida Para Term Preterm AB TAB SAB Ectopic Multiple Living   5 3 3  0 1 0 1 0 0 3      Social History: History   Social History  . Marital Status: Single    Spouse Name: N/A  . Number of Children: N/A  . Years of Education: N/A   Social History Main Topics  . Smoking status: Never Smoker   . Smokeless tobacco: Not on file  . Alcohol Use: No  . Drug Use: No  . Sexual Activity: Not on file   Other Topics Concern  . Not on file   Social History Narrative    Family History: No family history on file.  Allergies: No Known Allergies  Prescriptions prior to admission  Medication Sig Dispense Refill Last Dose  . acetaminophen (TYLENOL) 325 MG tablet Take 325 mg by mouth every 6 (six) hours as needed for mild pain.   Past Month at Unknown time  . Prenatal Vit-Fe Fumarate-FA (PRENATAL MULTIVITAMIN) TABS Take 1 tablet by mouth every morning.   03/12/2015 at Unknown time     Review of Systems   All systems reviewed and negative except as stated in HPI  Blood pressure 143/82, pulse 81, temperature 98.2 F (36.8 C), temperature source Oral, resp. rate 18, weight 147 lb (66.679 kg), unknown if currently breastfeeding. General appearance: alert and cooperative Lungs: clear to auscultation bilaterally Heart: regular rate and rhythm Abdomen: soft, non-tender; bowel sounds normal Extremities: Homans  sign is negative, no sign of DVT Presentation: transverse initially ==> then vertex Fetal monitoring: cat I, q3-654min Dilation: 4 Effacement (%): 70 Exam by:: Walker KehrNicole Dunbar RN  (Eda At bedside interp)   Prenatal labs: ABO, Rh:  pos Antibody:  neg Rubella:  immune RPR:   neg HBsAg:   neg HIV:   NR GBS:   pos 1 hr Glucola 118 Genetic screening  Neg quad Anatomy US normal  Prenatal Transfer Tool  Maternal Diabetes: No Genetic Screening: Normal Maternal Ultrasounds/Referrals: Normal Fetal Ultrasounds or other Referrals:  None Maternal Substance Abuse:  No Significant Maternal Medications:  None Significant Maternal Lab Results: None  No results found for this or any previous visit (from the past 24 hour(s)).  There are no active problems to display for this patient.   Assessment: Joy [name] is a 34 y.o. [phone] at 6447w6d here in early latent labor with some vaginal bleeding  #Labor:expectant mgmt, early latent labor however in light of vaginal bleeding will admit. #Pain: Epidural once transferred to L&D #FWB: Cat I #ID:  GBS pos => PCN #MOF: breast #MOC:mirena  Nichalas Coin ROCIO 03/13/2015, 12:11 PM

## 2015-03-13 NOTE — MAU Note (Addendum)
Contractions every 3 minutes. Complains of bright red vaginal bleeding/spotting Decreased fetal movement DeniesSROM/LOF Denies any infections/complications of pregnancy  GBS positvie per patient

## 2015-03-13 NOTE — Progress Notes (Addendum)
I assisted RN Orlean BradfordLeigha,with some questions , also Campbell SoupKim CNM and Dr Despina HiddenEure with some questions and explanations of plan of care, by Orlan LeavensViria Alvarez Spanish Interpreter

## 2015-03-13 NOTE — MAU Note (Signed)
Consent for release of information obtained

## 2015-03-13 NOTE — MAU Note (Addendum)
Started spotting this morning.  Few, irreg contractions past few days.  More regular since saw bleeding.    Was closed with transverse lie yesterday.  Was supposed to have version tomorrow.  [place] at [place].

## 2015-03-13 NOTE — Progress Notes (Signed)
Joy [name] is a 34 y.o. [phone] at 3670w6d  Subjective: Ctx have spaced out some over the past couple of hours  Objective: BP 147/79 mmHg  Pulse 64  Temp(Src) 97.9 F (36.6 C) (Oral)  Resp 18  Ht 5\' 2"  (1.575 m)  Wt 66.679 kg (147 lb)  BMI 26.88 kg/m2      FHT:  FHR: 160s bpm, variability: moderate,  accelerations:  Present,  decelerations:  Absent; short period of time w/ FHR 160-170s UC:   Initially irreg, but began to become q 2-4 mins after AROM SVE:   Dilation: 5 Effacement (%): 70 Station: -2 Exam by:: MD Eure- AROM for thin MSF; vtx @ -2 station w/ slight pressure held as fetal head tends towards pt's right  Labs: Lab Results  Component Value Date   WBC 9.0 03/13/2015   HGB 13.3 03/13/2015   HCT 38.4 03/13/2015   MCV 93.7 03/13/2015   PLT 154 03/13/2015    Assessment / Plan: IOL process  Hopeful that AROM will increase labor Anticipate SVD Will place abd binder  SHAW, KIMBERLY CNM 03/13/2015, 10:51 PM

## 2015-03-14 ENCOUNTER — Encounter (HOSPITAL_COMMUNITY): Payer: Self-pay | Admitting: Certified Registered Nurse Anesthetist

## 2015-03-14 ENCOUNTER — Encounter (HOSPITAL_COMMUNITY): Payer: Self-pay | Admitting: *Deleted

## 2015-03-14 DIAGNOSIS — R531 Weakness: Secondary | ICD-10-CM

## 2015-03-14 DIAGNOSIS — Z3A39 39 weeks gestation of pregnancy: Secondary | ICD-10-CM

## 2015-03-14 DIAGNOSIS — O09523 Supervision of elderly multigravida, third trimester: Secondary | ICD-10-CM

## 2015-03-14 DIAGNOSIS — O99824 Streptococcus B carrier state complicating childbirth: Secondary | ICD-10-CM

## 2015-03-14 DIAGNOSIS — R200 Anesthesia of skin: Secondary | ICD-10-CM

## 2015-03-14 LAB — RPR: RPR Ser Ql: NONREACTIVE

## 2015-03-14 LAB — CBC
HCT: 30.5 % — ABNORMAL LOW (ref 36.0–46.0)
HEMOGLOBIN: 10.7 g/dL — AB (ref 12.0–15.0)
MCH: 32.8 pg (ref 26.0–34.0)
MCHC: 35.1 g/dL (ref 30.0–36.0)
MCV: 93.6 fL (ref 78.0–100.0)
Platelets: 134 10*3/uL — ABNORMAL LOW (ref 150–400)
RBC: 3.26 MIL/uL — AB (ref 3.87–5.11)
RDW: 14 % (ref 11.5–15.5)
WBC: 16.3 10*3/uL — AB (ref 4.0–10.5)

## 2015-03-14 LAB — HIV ANTIBODY (ROUTINE TESTING W REFLEX): HIV Screen 4th Generation wRfx: NONREACTIVE

## 2015-03-14 MED ORDER — WITCH HAZEL-GLYCERIN EX PADS: 1.0000 "application " | MEDICATED_PAD | CUTANEOUS | Status: DC | PRN

## 2015-03-14 MED ORDER — LANOLIN HYDROUS EX OINT: TOPICAL_OINTMENT | CUTANEOUS | Status: DC | PRN

## 2015-03-14 MED ORDER — ZOLPIDEM TARTRATE 5 MG PO TABS: 5.0000 mg | ORAL_TABLET | Freq: Every evening | ORAL | Status: DC | PRN

## 2015-03-14 MED ORDER — OXYCODONE-ACETAMINOPHEN 5-325 MG PO TABS: 2.0000 | ORAL_TABLET | ORAL | Status: DC | PRN

## 2015-03-14 MED ORDER — OXYTOCIN 40 UNITS IN LACTATED RINGERS INFUSION - SIMPLE MED
999.0000 mL/h | INTRAVENOUS | Status: AC
  Filled 2015-03-14: qty 1000

## 2015-03-14 MED ORDER — OXYTOCIN 10 UNIT/ML IJ SOLN
INTRAMUSCULAR | Status: AC
  Filled 2015-03-14: qty 1

## 2015-03-14 MED ORDER — DIBUCAINE 1 % RE OINT: 1.0000 "application " | TOPICAL_OINTMENT | RECTAL | Status: DC | PRN

## 2015-03-14 MED ORDER — METHYLERGONOVINE MALEATE 0.2 MG PO TABS
0.2000 mg | ORAL_TABLET | ORAL | Status: AC
  Administered 2015-03-14 – 2015-03-15 (×6): 0.2 mg via ORAL
  Filled 2015-03-14 (×7): qty 1

## 2015-03-14 MED ORDER — ONDANSETRON HCL 4 MG PO TABS: 4.0000 mg | ORAL_TABLET | ORAL | Status: DC | PRN

## 2015-03-14 MED ORDER — DIPHENHYDRAMINE HCL 25 MG PO CAPS: 25.0000 mg | ORAL_CAPSULE | Freq: Four times a day (QID) | ORAL | Status: DC | PRN

## 2015-03-14 MED ORDER — FENTANYL CITRATE (PF) 100 MCG/2ML IJ SOLN
INTRAMUSCULAR | Status: AC
  Administered 2015-03-14: 100 ug via INTRAVENOUS
  Filled 2015-03-14: qty 2

## 2015-03-14 MED ORDER — BENZOCAINE-MENTHOL 20-0.5 % EX AERO: 1.0000 "application " | INHALATION_SPRAY | CUTANEOUS | Status: DC | PRN

## 2015-03-14 MED ORDER — ACETAMINOPHEN 325 MG PO TABS: 650.0000 mg | ORAL_TABLET | ORAL | Status: DC | PRN

## 2015-03-14 MED ORDER — OXYTOCIN 10 UNIT/ML IJ SOLN
40.0000 [IU] | INTRAVENOUS | Status: DC
  Filled 2015-03-14: qty 4

## 2015-03-14 MED ORDER — PRENATAL MULTIVITAMIN CH
1.0000 | ORAL_TABLET | Freq: Every day | ORAL | Status: DC
  Administered 2015-03-14 – 2015-03-15 (×2): 1 via ORAL
  Filled 2015-03-14 (×2): qty 1

## 2015-03-14 MED ORDER — OXYCODONE-ACETAMINOPHEN 5-325 MG PO TABS: 1.0000 | ORAL_TABLET | ORAL | Status: DC | PRN

## 2015-03-14 MED ORDER — SIMETHICONE 80 MG PO CHEW: 80.0000 mg | CHEWABLE_TABLET | ORAL | Status: DC | PRN

## 2015-03-14 MED ORDER — SENNOSIDES-DOCUSATE SODIUM 8.6-50 MG PO TABS
2.0000 | ORAL_TABLET | ORAL | Status: DC
  Administered 2015-03-15: 2 via ORAL
  Filled 2015-03-14: qty 2

## 2015-03-14 MED ORDER — TETANUS-DIPHTH-ACELL PERTUSSIS 5-2.5-18.5 LF-MCG/0.5 IM SUSP: 0.5000 mL | Freq: Once | INTRAMUSCULAR | Status: DC

## 2015-03-14 MED ORDER — IBUPROFEN 600 MG PO TABS
600.0000 mg | ORAL_TABLET | Freq: Four times a day (QID) | ORAL | Status: DC
  Administered 2015-03-14 – 2015-03-15 (×6): 600 mg via ORAL
  Filled 2015-03-14 (×5): qty 1

## 2015-03-14 MED ORDER — METHYLERGONOVINE MALEATE 0.2 MG/ML IJ SOLN
INTRAMUSCULAR | Status: AC
  Administered 2015-03-14: 0.2 mg
  Filled 2015-03-14: qty 1

## 2015-03-14 MED ORDER — METHYLERGONOVINE MALEATE 0.2 MG/ML IJ SOLN: 0.2000 mg | Freq: Once | INTRAMUSCULAR | Status: DC

## 2015-03-14 MED ORDER — ONDANSETRON HCL 4 MG/2ML IJ SOLN: 4.0000 mg | INTRAMUSCULAR | Status: DC | PRN

## 2015-03-14 MED ORDER — OXYTOCIN 40 UNITS IN LACTATED RINGERS INFUSION - SIMPLE MED
INTRAVENOUS | Status: AC
  Filled 2015-03-14: qty 1000

## 2015-03-14 MED ORDER — OXYTOCIN 40 UNITS IN LACTATED RINGERS INFUSION - SIMPLE MED
125.0000 mL/h | INTRAVENOUS | Status: DC
  Administered 2015-03-14: 125 mL/h via INTRAVENOUS

## 2015-03-14 MED ORDER — FENTANYL CITRATE (PF) 100 MCG/2ML IJ SOLN: 100.0000 ug | Freq: Once | INTRAMUSCULAR | Status: DC

## 2015-03-14 NOTE — Progress Notes (Signed)
Notified Dr.Wallace of CBC results drawn at 1000 today. No new orders given.

## 2015-03-14 NOTE — Progress Notes (Signed)
At 0630 assessed patient for 1 hour check and ambulated patient to the bathroom. Uterus was firm and even prior to ambulating to bathroom. While on the toilet patient stated that she felt dizzy and became very pale. Assisted patient back to bed and ammonia placed under patients nose. Uterus assessed and very large clots were found to be coming out (weighed 450 ml). Code hemorrhage called. Rapid response arrived and Dr, Despina HiddenEure came to assess patient. Clots were found inside patients uterus. VS stable 136/68, HR 97 and 141/72, HR 96. 98% on room air. IM methergine given in left thigh (0.2). Bolus of LR and Pit @ 999 ml/hr given. Fentanyl given @ 0705 for patients discomfort. Patient left lying in bed. Patient denies any pain. Patient states that she is sleepy. Uterus firm and even. VS 153/80 and HR 80. Explained to patient not to get up without assistance and to call out if she felt that she was continuously bleeding. Will continue to monitor.

## 2015-03-14 NOTE — Progress Notes (Signed)
UR chart review completed.  

## 2015-03-14 NOTE — Progress Notes (Signed)
Late entry  I was called to room for code hemorrhage at 0640  Pt passed a 450 cc clot when she went to the BR, pt dizzy P90 BP ok  Uterus boggy with lower uterine segment clot presnet, additional 236 cc of blood clot evacuated  Manual exploration of uterus with small clots no definitive retained POC after careful evaluation  Methergine 0.2 mg IM given and 1 liter of LR with 40 units of pitocin @ 999 cc/hr to continue a liter at 125/hr  Uterus now firm and no significant bleeding

## 2015-03-14 NOTE — Progress Notes (Signed)
Patient requesting IV pain medication, but dilated 9.5, denies urgency to push. I called Cam HaiKimberly Shaw CNM who instructed to not give IV pain medication to patient at this time.  Wolfgang PhoenixLeigha Rozalynn Buege RN

## 2015-03-14 NOTE — Lactation Note (Signed)
This note was copied from the chart of Joy [name]. Lactation Consultation Note  Spanish interpreter present. PPH.   P4, Ex BF.  Mother states she bf first child for 1 month, 2nd child 1.5 years, 3rd child for almost 3 years. Denies problems or concerns with breastfeeding.  Reviewed supply demand, cluster feeding.  Mom encouraged to feed baby 8-12 times/24 hours and with feeding cues.  Mom made aware of O/P services, breastfeeding support groups, community resources, and our phone # for post-discharge questions in Spanish.      Patient Name: Joy [name] MVHQI'OToday's Date: 03/14/2015 Reason for consult: Initial assessment   Maternal Data    Feeding Feeding Type: Breast Fed Length of feed: 5 min  LATCH Score/Interventions                      Lactation Tools Discussed/Used     Consult Status Consult Status: Follow-up Date: 03/15/15 Follow-up type: In-patient    Dahlia ByesBerkelhammer, [facility] 03/14/2015, 5:00 PM

## 2015-03-14 NOTE — Progress Notes (Addendum)
I stopped to check on patients need I ordered her meals, I also assisted RN Windell Mouldinguth from Lactation, by Orlan LeavensViria Alvarez Spanish Interpreter

## 2015-03-15 ENCOUNTER — Ambulatory Visit: Payer: Self-pay

## 2015-03-15 LAB — RUBELLA SCREEN: RUBELLA: 1.37 {index} (ref >0.99)

## 2015-03-15 MED ORDER — IBUPROFEN 600 MG PO TABS: 600.0000 mg | ORAL_TABLET | Freq: Four times a day (QID) | ORAL | Status: DC

## 2015-03-15 NOTE — Discharge Instructions (Signed)

## 2015-03-15 NOTE — Lactation Note (Signed)
This note was copied from the chart of Joy [name]. Lactation Consultation Note  [employer] Interpreter Used [phone]#[id number]. P4, Ex BF, Baby latched in cross cradle position upon entering. Rhythmical sucks and swallows observed. Mother denies soreness or questions. Encouraged her to call if she needs further assistance.   Patient Name: Joy [name] [id number]'s Date: 03/15/2015 Reason for consult: Follow-up assessment   Maternal Data    Feeding Feeding Type: Breast Fed Length of feed: 30 min  LATCH Score/Interventions Latch: Repeated attempts needed to sustain latch, nipple held in mouth throughout feeding, stimulation needed to elicit sucking reflex. (latched upon entering) Intervention(s): Adjust position;Breast compression  Audible Swallowing: Spontaneous and intermittent  Type of Nipple: Everted at rest and after stimulation  Comfort (Breast/Nipple): Soft / non-tender     Hold (Positioning): No assistance needed to correctly position infant at breast.  LATCH Score: 9  Lactation Tools Discussed/Used     Consult Status Consult Status: PRN    Hardie PulleyBerkelhammer, Ruth Boschen 03/15/2015, 4:39 PM

## 2015-03-15 NOTE — Discharge Summary (Signed)
Obstetric Discharge Summary Reason for Admission: onset of labor Prenatal Procedures: ultrasound Intrapartum Procedures: spontaneous vaginal delivery Postpartum Procedures: none Complications-Operative and Postpartum: none HEMOGLOBIN  Date Value Ref Range Status  03/14/2015 10.7* 12.0 - 15.0 g/dL Final   HCT  Date Value Ref Range Status  03/14/2015 30.5* 36.0 - 46.0 % Final    Admitted in active labor.  Received more than adequate GBS prophylaxis.  Had an uncomplicated SVD.  Had some left leg numbness/weakness for about 8 hours after delivery, completely resolved now. Anesthesia thought the epidural was just taking a while to wear off.  Pt desires DC home today. Plans IUD  Physical Exam:  General: alert, cooperative and no distress Lochia: appropriate Chest:  Normal respiratory effort Uterine Fundus: firm Incision: n/a DVT Evaluation: No evidence of DVT seen on physical exam. Legs no longer numb  Discharge Diagnoses: Term Pregnancy-delivered  Discharge Information: Date: 03/15/2015 Activity: pelvic rest Diet: routine Medications: Ibuprofen Condition: stable Instructions: refer to practice specific booklet Discharge to: home Follow-up Information    [facility] [place] Schedule an appointment as soon as possible for a visit in 6 weeks.   Why:  postpartum checkup   Contact information:   [address] [place] [id number] [phone]      Newborn Data: Live born female  Birth Weight: 8 lb 5 oz (3770 g) APGAR: 8, 9  Home with mother.  Joy [name] 03/15/2015, 7:39 AM

## 2015-03-16 ENCOUNTER — Ambulatory Visit: Payer: Self-pay

## 2015-03-16 LAB — TYPE AND SCREEN
ABO/RH(D): O POS
Antibody Screen: NEGATIVE
UNIT DIVISION: 0
Unit division: 0

## 2015-03-16 NOTE — Lactation Note (Signed)
This note was copied from the chart of Joy [name]. Lactation Consultation Note Interpreter present in room. Mom had given baby a whole bottle as supplement because baby's stomach was growling and baby had cluster feeding so mom didn't think the baby was getting enough milk. Assessed mom's breast, hand expressed easily transitional colostrum. Explained the cluster feeding and how it stimulates milk to come in, moms breast are filling, explained engorgement and risk of mastitis. Mom stated with her 2nd child she got that. Encouraged mom to get a deep latch, may need to do chin tug d/t recessed chin of baby. Denies painful latching. Explained shallow/verses/deep latch and milk transfer. Encouraged mom to do STS and not have baby swaddled in blankets during BF. Stressed proper positioning during BF and preventing sore nipples.  Mom experienced BF over 2 1/2 yrs. Just needed reminding of BF newborn.  Patient Name: Joy [name] [id number]'BToday's Date: 03/16/2015 Reason for consult: Follow-up assessment   Maternal Data    Feeding Feeding Type: Bottle Fed - Breast Milk Length of feed: 20 min  LATCH Score/Interventions       Type of Nipple: Everted at rest and after stimulation  Comfort (Breast/Nipple): Soft / non-tender     Intervention(s): Breastfeeding basics reviewed;Support Pillows;Position options;Skin to skin     Lactation Tools Discussed/Used     Consult Status Consult Status: Complete    Allesha Aronoff G 03/16/2015, 11:16 AM

## 2015-09-23 ENCOUNTER — Emergency Department (HOSPITAL_COMMUNITY)
Admission: EM | Admit: 2015-09-23 | Discharge: 2015-09-23 | Disposition: A | Discharge status: Home / Self Care | Payer: Medicaid Other | Attending: Emergency Medicine | Admitting: Emergency Medicine

## 2015-09-23 ENCOUNTER — Emergency Department (HOSPITAL_COMMUNITY): Payer: Medicaid Other

## 2015-09-23 ENCOUNTER — Encounter (HOSPITAL_COMMUNITY): Payer: Self-pay | Admitting: Emergency Medicine

## 2015-09-23 DIAGNOSIS — Z3202 Encounter for pregnancy test, result negative: Secondary | ICD-10-CM | POA: Insufficient documentation

## 2015-09-23 DIAGNOSIS — R0602 Shortness of breath: Secondary | ICD-10-CM | POA: Insufficient documentation

## 2015-09-23 DIAGNOSIS — F419 Anxiety disorder, unspecified: Secondary | ICD-10-CM | POA: Insufficient documentation

## 2015-09-23 DIAGNOSIS — I471 Supraventricular tachycardia: Secondary | ICD-10-CM | POA: Insufficient documentation

## 2015-09-23 DIAGNOSIS — R1110 Vomiting, unspecified: Secondary | ICD-10-CM | POA: Insufficient documentation

## 2015-09-23 DIAGNOSIS — R1012 Left upper quadrant pain: Secondary | ICD-10-CM | POA: Insufficient documentation

## 2015-09-23 LAB — CBC
HCT: 42.7 % (ref 36.0–46.0)
HEMOGLOBIN: 14.3 g/dL (ref 12.0–15.0)
MCH: 30.6 pg (ref 26.0–34.0)
MCHC: 33.5 g/dL (ref 30.0–36.0)
MCV: 91.2 fL (ref 78.0–100.0)
PLATELETS: 262 10*3/uL (ref 150–400)
RBC: 4.68 MIL/uL (ref 3.87–5.11)
RDW: 13.6 % (ref 11.5–15.5)
WBC: 12.4 10*3/uL — AB (ref 4.0–10.5)

## 2015-09-23 LAB — HEPATIC FUNCTION PANEL
ALK PHOS: 138 U/L — AB (ref 38–126)
ALT: 105 U/L — ABNORMAL HIGH (ref 14–54)
AST: 58 U/L — AB (ref 15–41)
Albumin: 3.8 g/dL (ref 3.5–5.0)
BILIRUBIN DIRECT: 0.2 mg/dL (ref 0.1–0.5)
BILIRUBIN TOTAL: 0.9 mg/dL (ref 0.3–1.2)
Indirect Bilirubin: 0.7 mg/dL (ref 0.3–0.9)
Total Protein: 7.9 g/dL (ref 6.5–8.1)

## 2015-09-23 LAB — BASIC METABOLIC PANEL
ANION GAP: 20 — AB (ref 5–15)
BUN: 16 mg/dL (ref 6–20)
CHLORIDE: 101 mmol/L (ref 101–111)
CO2: 21 mmol/L — ABNORMAL LOW (ref 22–32)
Calcium: 9.1 mg/dL (ref 8.9–10.3)
Creatinine, Ser: 0.89 mg/dL (ref 0.44–1.00)
GFR calc Af Amer: >60 mL/min (ref >60)
GLUCOSE: 170 mg/dL — AB (ref 65–99)
Potassium: 3.1 mmol/L — ABNORMAL LOW (ref 3.5–5.1)
Sodium: 142 mmol/L (ref 135–145)

## 2015-09-23 LAB — I-STAT TROPONIN, ED: Troponin i, poc: 0 ng/mL (ref 0.00–0.08)

## 2015-09-23 LAB — PREGNANCY, URINE: Preg Test, Ur: NEGATIVE

## 2015-09-23 LAB — LIPASE, BLOOD: Lipase: 37 U/L (ref 11–51)

## 2015-09-23 MED ORDER — POTASSIUM CHLORIDE CRYS ER 20 MEQ PO TBCR
60.0000 meq | EXTENDED_RELEASE_TABLET | Freq: Once | ORAL | Status: AC
  Administered 2015-09-23: 60 meq via ORAL
  Filled 2015-09-23: qty 3

## 2015-09-23 MED ORDER — IBUPROFEN 400 MG PO TABS
600.0000 mg | ORAL_TABLET | Freq: Once | ORAL | Status: AC
  Administered 2015-09-23: 600 mg via ORAL
  Filled 2015-09-23: qty 1

## 2015-09-23 MED ORDER — SODIUM CHLORIDE 0.9 % IV BOLUS (SEPSIS)
1000.0000 mL | Freq: Once | INTRAVENOUS | Status: AC
  Administered 2015-09-23: 1000 mL via INTRAVENOUS

## 2015-09-23 NOTE — ED Notes (Signed)
C/o L sided chest pain and sob all day.  HR 235.  Pt straight to trauma room.

## 2015-09-23 NOTE — ED Provider Notes (Signed)
CSN: [id number]     Arrival date & time 09/23/15  0012 History   First MD Initiated Contact with Patient 09/23/15 0031     Chief Complaint  Patient presents with  . Chest Pain  . SVT      (Consider location/radiation/quality/duration/timing/severity/associated sxs/prior Treatment) HPI Comments: 35 year old female who presents with chest pain and shortness of breath. History limited because of the patient's acuity of condition as well as language barrier as patient is Spanish-speaking. She reported to triage that she had left-sided chest pain and shortness of breath all day. During my evaluation, she stated that she began feeling short of breath at 11:30 PM tonight. Husband reports 1 episode of vomiting this morning and one this evening. She endorses intermittent left-sided abdominal pain that comes and goes randomly.  Patient is a 35 y.o. female presenting with chest pain. The history is provided by the patient and the spouse.  Chest Pain   History reviewed. No pertinent past medical history. History reviewed. No pertinent past surgical history. No family history on file. Social History  Substance Use Topics  . Smoking status: Never Smoker   . Smokeless tobacco: Never Used  . Alcohol Use: No   OB History    Gravida Para Term Preterm AB TAB SAB Ectopic Multiple Living   5 4 4  0 1 0 1 0 0 2     Review of Systems  Cardiovascular: Positive for chest pain.   10 Systems reviewed and are negative for acute change except as noted in the HPI.    Allergies  Review of patient's allergies indicates no known allergies.  Home Medications   Prior to Admission medications   Medication Sig Start Date End Date Taking? Authorizing Provider  acetaminophen (TYLENOL) 325 MG tablet Take 650 mg by mouth every 6 (six) hours as needed for mild pain.   Yes Historical Provider, MD  Multiple Vitamin (MULTIVITAMIN) tablet Take 1 tablet by mouth daily.   Yes Historical Provider, MD  ibuprofen  (ADVIL,MOTRIN) 600 MG tablet Take 1 tablet (600 mg total) by mouth every 6 (six) hours. Patient not taking: Reported on 09/23/2015 03/15/15   Jacklyn Shell, CNM   BP 123/86 mmHg  Pulse 86  Temp(Src) 97.9 F (36.6 C) (Oral)  Resp 19  SpO2 99% Physical Exam  Constitutional: She is oriented to person, place, and time. She appears well-developed and well-nourished.  Anxious, tearful  HENT:  Head: Normocephalic and atraumatic.  Moist mucous membranes  Eyes: Conjunctivae are normal. Pupils are equal, round, and reactive to light.  Neck: Neck supple.  Cardiovascular: Regular rhythm and normal heart sounds.   No murmur heard. tachycardic  Pulmonary/Chest: Breath sounds normal. No respiratory distress.  Mildly increased WOB  Abdominal: Soft. Bowel sounds are normal. She exhibits no distension.  Mild LUQ and left midabdominal TTP, no rebound or guarding  Musculoskeletal: She exhibits no edema.  Neurological: She is alert and oriented to person, place, and time.  Fluent speech  Skin: Skin is warm and dry.  Psychiatric: She has a normal mood and affect. Judgment normal.  anxious  Nursing note and vitals reviewed.   ED Course  .Critical Care Performed by: Laurence Spates Authorized by: Laurence Spates Total critical care time: 30 minutes Critical care time was exclusive of separately billable procedures and treating other patients. Critical care was necessary to treat or prevent imminent or life-threatening deterioration of the following conditions: cardiac failure. Critical care was time spent personally by me on the following  activities: development of treatment plan with patient or surrogate, evaluation of patient's response to treatment, examination of patient, obtaining history from patient or surrogate, ordering and performing treatments and interventions, ordering and review of laboratory studies, ordering and review of radiographic studies and re-evaluation of  patient's condition.   (including critical care time) Labs Review Labs Reviewed  BASIC METABOLIC PANEL - Abnormal; Notable for the following:    Potassium 3.1 (*)    CO2 21 (*)    Glucose, Bld 170 (*)    Anion gap 20 (*)    All other components within normal limits  CBC - Abnormal; Notable for the following:    WBC 12.4 (*)    All other components within normal limits  HEPATIC FUNCTION PANEL - Abnormal; Notable for the following:    AST 58 (*)    ALT 105 (*)    Alkaline Phosphatase 138 (*)    All other components within normal limits  LIPASE, BLOOD  PREGNANCY, URINE  I-STAT TROPOININ, ED    Imaging Review Dg Chest Portable 1 View  09/23/2015  CLINICAL DATA:  Acute onset of generalized chest pain. Initial encounter. EXAM: PORTABLE CHEST 1 VIEW COMPARISON:  None. FINDINGS: The lungs are well-aerated and clear. There is no evidence of focal opacification, pleural effusion or pneumothorax. The cardiomediastinal silhouette is borderline normal in size. External pacing pads are noted. No acute osseous abnormalities are seen. IMPRESSION: No acute cardiopulmonary process seen. Electronically Signed   By: Roanna Raider M.D.   On: 09/23/2015 01:37   I have personally reviewed and evaluated these lab results as part of my medical decision-making.   EKG Interpretation   Date/Time:  Monday September 23 2015 01:11:04 EST Ventricular Rate:  96 PR Interval:  142 QRS Duration: 106 QT Interval:  372 QTC Calculation: 470 R Axis:   75 Text Interpretation:  Sinus rhythm Borderline repolarization abnormality  ST depressions from previous EKG have improved Confirmed by LITTLE MD,  RACHEL [phone]) on 09/23/2015 1:17:49 AM     Medications  potassium chloride SA (K-DUR,KLOR-CON) CR tablet 60 mEq (60 mEq Oral Given 09/23/15 0221)  sodium chloride 0.9 % bolus 1,000 mL (0 mLs Intravenous Stopped 09/23/15 0529)  ibuprofen (ADVIL,MOTRIN) tablet 600 mg (600 mg Oral Given 09/23/15 0542)    MDM   Final  diagnoses:  SVT (supraventricular tachycardia) (HCC)    Patient presents with chest pain, shortness of breath, and heart racing that began this evening in the setting of a few episodes of vomiting today. Patient was immediately brought back from triage when she was noted to have heart rate in the 200s and BP 72/50. Immediately placed patient on cardiac monitoring and pacer pads. She was anxious but mentating appropriately. EKG showed SVT. Performed modified Valsalva maneuver which converted patient's rhythm to normal sinus rhythm. Initial repeat EKG showed diffuse ST depression in all leads, however repeat EKG was improved. Labs notable for potassium 3.1, CO2 21, glucose 170, AST 58, ALT 105. I suspect hyperglycemia is due to stress response and on reexamination the patient continues to deny any right-sided abdominal pain to suggest liver or gallbladder pathology. Gave the patient oral potassium. Later gave IVF bolus and ibuprofen for headache. Observed pt for >5 hours during which she was able to tolerate liquids and ambulate in the room without difficulty. On reexamination, she was resting comfortably with normal heart rate. I provided with cardiology follow-up information and reviewed via interpreter the patient's diagnosis, lab results, and treatment of SVT at  home. Emphasized return precautions including episodes unresponsive to home with vagal maneuvers. The patient and her husband voiced understanding and patient was discharged in satisfactory condition.   Laurence Spatesachel Morgan Little, MD 09/23/15 [phone]

## 2015-09-23 NOTE — ED Notes (Signed)
Assisted pt to bedside commode in order to collect urine sample. Pt show orthostatic HR changes during slow, purposeful movement from lying to standing so that she could toilet. Pt denied dizziness or possible LOC but displayed tachycardic changes on the monitor >30bpm during the effort. Communicated this to MD, who ordered fluid bolus.

## 2015-09-23 NOTE — ED Notes (Signed)
Pt departed in NAD.  

## 2015-09-23 NOTE — ED Notes (Signed)
Pt resting, comfortable at present given warm blankets.

## 2015-09-23 NOTE — ED Notes (Signed)
MD Little at bedside to evaluate. Vagal maneuvers attempted; first attempt successful with near immediate response. HR dropped from 230 to 79 bpm.

## 2016-01-24 LAB — GLUCOSE, POCT (MANUAL RESULT ENTRY): POC Glucose: 82 mg/dl (ref 70–99)

## 2017-11-09 LAB — HM HIV SCREENING LAB: HM HIV Screening: NEGATIVE

## 2017-12-24 ENCOUNTER — Emergency Department: Payer: Self-pay

## 2017-12-24 ENCOUNTER — Other Ambulatory Visit: Payer: Self-pay

## 2017-12-24 ENCOUNTER — Emergency Department
Admission: EM | Admit: 2017-12-24 | Discharge: 2017-12-24 | Disposition: A | Discharge status: Home / Self Care | Payer: Self-pay | Attending: Emergency Medicine | Admitting: Emergency Medicine

## 2017-12-24 ENCOUNTER — Encounter: Payer: Self-pay | Admitting: Emergency Medicine

## 2017-12-24 DIAGNOSIS — O2340 Unspecified infection of urinary tract in pregnancy, unspecified trimester: Secondary | ICD-10-CM | POA: Insufficient documentation

## 2017-12-24 DIAGNOSIS — N939 Abnormal uterine and vaginal bleeding, unspecified: Secondary | ICD-10-CM

## 2017-12-24 DIAGNOSIS — R300 Dysuria: Secondary | ICD-10-CM | POA: Insufficient documentation

## 2017-12-24 DIAGNOSIS — R319 Hematuria, unspecified: Secondary | ICD-10-CM | POA: Insufficient documentation

## 2017-12-24 DIAGNOSIS — N390 Urinary tract infection, site not specified: Secondary | ICD-10-CM

## 2017-12-24 DIAGNOSIS — Z79899 Other long term (current) drug therapy: Secondary | ICD-10-CM | POA: Insufficient documentation

## 2017-12-24 DIAGNOSIS — O4692 Antepartum hemorrhage, unspecified, second trimester: Secondary | ICD-10-CM

## 2017-12-24 DIAGNOSIS — Z3A15 15 weeks gestation of pregnancy: Secondary | ICD-10-CM | POA: Insufficient documentation

## 2017-12-24 DIAGNOSIS — R109 Unspecified abdominal pain: Secondary | ICD-10-CM | POA: Insufficient documentation

## 2017-12-24 LAB — URINALYSIS, COMPLETE (UACMP) WITH MICROSCOPIC
Bilirubin Urine: NEGATIVE
GLUCOSE, UA: NEGATIVE mg/dL
Ketones, ur: NEGATIVE mg/dL
Nitrite: NEGATIVE
Protein, ur: NEGATIVE mg/dL
SPECIFIC GRAVITY, URINE: 1.005 (ref 1.005–1.030)
pH: 7 (ref 5.0–8.0)

## 2017-12-24 LAB — HCG, QUANTITATIVE, PREGNANCY: HCG, BETA CHAIN, QUANT, S: 45174 m[IU]/mL — AB (ref <5)

## 2017-12-24 LAB — POCT PREGNANCY, URINE: PREG TEST UR: POSITIVE — AB

## 2017-12-24 MED ORDER — NITROFURANTOIN MONOHYD MACRO 100 MG PO CAPS: 100.0000 mg | ORAL_CAPSULE | Freq: Two times a day (BID) | ORAL | 0 refills | Status: AC

## 2017-12-24 MED ORDER — NITROFURANTOIN MONOHYD MACRO 100 MG PO CAPS
100.0000 mg | ORAL_CAPSULE | Freq: Once | ORAL | Status: AC
  Administered 2017-12-24: 100 mg via ORAL
  Filled 2017-12-24 (×2): qty 1

## 2017-12-24 NOTE — Discharge Instructions (Signed)
Please take all of your antibiotics as prescribed and [facility].  Return to the emergency department for any new or worsening symptoms such as fevers, chills, back pain, worsening symptoms, or for any other issues whatsoever.  It was a pleasure to take care of you today, and thank you for coming to our emergency department.  If you have any questions or concerns before leaving please ask the nurse to grab me and I'm more than happy to go through your aftercare instructions again.  If you were prescribed any opioid pain medication today such as Norco, Vicodin, Percocet, morphine, hydrocodone, or oxycodone please make sure you do not drive when you are taking this medication as it can alter your ability to drive safely.  If you have any concerns once you are home that you are not improving or are in fact getting worse before you can make it to your follow-up appointment, please do not hesitate to call 911 and come back for further evaluation.  Merrily BrittleNeil Ashe Graybeal, MD  Results for orders placed or performed during the hospital encounter of 12/24/17  Urinalysis, Complete w Microscopic  Result Value Ref Range   Color, Urine STRAW (A) YELLOW   APPearance CLEAR (A) CLEAR   Specific Gravity, Urine 1.005 1.005 - 1.030   pH 7.0 5.0 - 8.0   Glucose, UA NEGATIVE NEGATIVE mg/dL   Hgb urine dipstick LARGE (A) NEGATIVE   Bilirubin Urine NEGATIVE NEGATIVE   Ketones, ur NEGATIVE NEGATIVE mg/dL   Protein, ur NEGATIVE NEGATIVE mg/dL   Nitrite NEGATIVE NEGATIVE   Leukocytes, UA MODERATE (A) NEGATIVE   RBC / HPF TOO NUMEROUS TO COUNT 0 - 5 RBC/hpf   WBC, UA TOO NUMEROUS TO COUNT 0 - 5 WBC/hpf   Squamous Epithelial / LPF 0-5 (A) NONE SEEN  Pregnancy, urine POC  Result Value Ref Range   Preg Test, Ur POSITIVE (A) NEGATIVE   [place] Ob Limited  Result Date: 12/24/2017 CLINICAL DATA:  Sixteen weeks 2 days pregnant with vaginal spotting. EXAM: LIMITED OBSTETRIC ULTRASOUND  FINDINGS: Number of Fetuses: 1 Heart Rate:  153 bpm Movement: Yes Presentation: Cephalic Placental Location: Posterior Previa: Low lying placenta.  1.7 cm from internal os. Amniotic Fluid (Subjective):  Within normal limits. BPD: 3.2 cm 15 w  6 d MATERNAL FINDINGS: Cervix:  Appears closed.-4.7 cm Uterus/Adnexae: Normal.  No significant free fluid. IMPRESSION: 1. Intrauterine pregnancy of approximately 15 weeks 6 days with fetal heart rate of 153 beats per minute. 2. Posterior, low-lying placenta. This exam is performed on an emergent basis and does not comprehensively evaluate fetal size, dating, or anatomy; follow-up complete OB US should be considered if further fetal assessment is warranted. Electronically Signed   By: Jeronimo GreavesKyle  Talbot M.D.   On: 12/24/2017 18:17

## 2017-12-24 NOTE — ED Provider Notes (Signed)
[facility] Emergency Department Provider Note  ____________________________________________   First MD Initiated Contact with Patient 12/24/17 1813     (approximate)  I have reviewed the triage vital signs and the nursing notes.   HISTORY  Chief Complaint Abdominal Pain   HPI Joy BucyLucia Sanchez [name] is a 37 y.o. female who self presents to the emergency department with 1 day of dysuria and hematuria.  Associated with cramping suprapubic discomfort.  No back pain.  No fevers or chills.  She is roughly [redacted] weeks pregnant with her fifth pregnancy.  She has 4 healthy living children.  She does have prenatal care.  She denies fevers or chills.  Her symptoms are only when urinating and when not urinating she has no pain.  No nausea or vomiting.  Her pain is nonradiating.  History reviewed. No pertinent past medical history.  Patient Active Problem List   Diagnosis Date Noted  . Active labor 03/13/2015    History reviewed. No pertinent surgical history.  Prior to Admission medications   Medication Sig Start Date End Date Taking? Authorizing Provider  acetaminophen (TYLENOL) 325 MG tablet Take 650 mg by mouth every 6 (six) hours as needed for mild pain.    [provider]  ibuprofen (ADVIL,MOTRIN) 600 MG tablet Take 1 tablet (600 mg total) by mouth every 6 (six) hours. Patient not taking: Reported on 09/23/2015 03/15/15   Jacklyn Shellresenzo-Dishmon, Frances, CNM  Multiple Vitamin (MULTIVITAMIN) tablet Take 1 tablet by mouth daily.    [provider]  nitrofurantoin, macrocrystal-monohydrate, (MACROBID) 100 MG capsule Take 1 capsule (100 mg total) by mouth 2 (two) times daily for 7 days. 12/24/17 12/31/17  Merrily Brittleifenbark, Jakyri Brunkhorst, MD    Allergies Patient has no known allergies.  No family history on file.  Social History Social History   Tobacco Use  . Smoking status: Never Smoker  . Smokeless tobacco: Never Used  Substance Use Topics  . Alcohol use: No  .  Drug use: No    Review of Systems Constitutional: No fever/chills Eyes: No visual changes. ENT: No sore throat. Cardiovascular: Denies chest pain. Respiratory: Denies shortness of breath. Gastrointestinal: No abdominal pain.  No nausea, no vomiting.  No diarrhea.  No constipation. Genitourinary: Positive for dysuria. Musculoskeletal: Negative for back pain. Skin: Negative for rash. Neurological: Negative for headaches, focal weakness or numbness.   ____________________________________________   PHYSICAL EXAM:  VITAL SIGNS: ED Triage Vitals  Enc Vitals Group     BP 12/24/17 1640 (!) 146/82     Pulse Rate 12/24/17 1639 79     Resp 12/24/17 1639 16     Temp 12/24/17 1639 98 F (36.7 C)     Temp Source 12/24/17 1639 Oral     SpO2 12/24/17 1639 100 %     Weight 12/24/17 1640 124 lb (56.2 kg)     Height 12/24/17 1640 5\' 1"  (1.549 m)     Head Circumference --      Peak Flow --      Pain Score 12/24/17 1639 6     Pain Loc --      Pain Edu? --      Excl. in GC? --     Constitutional: Alert and oriented x4 joking laughing pleasant cooperative speaks in full clear sentences Eyes: PERRL EOMI. Head: Atraumatic. Nose: No congestion/rhinnorhea. Mouth/Throat: No trismus Neck: No stridor.   Cardiovascular: Normal rate, regular rhythm. Grossly normal heart sounds.  Good peripheral circulation. Respiratory: Normal respiratory effort.  No retractions.  Lungs CTAB and moving good air Gastrointestinal: Soft nondistended nontender no rebound or guarding no peritonitis no costovertebral tenderness Musculoskeletal: No lower extremity edema   Neurologic:  Normal speech and language. No gross focal neurologic deficits are appreciated. Skin:  Skin is warm, dry and intact. No rash noted. Psychiatric: Mood and affect are normal. Speech and behavior are normal.    ____________________________________________   DIFFERENTIAL includes but not limited to  Urinary tract infection, ectopic  pregnancy, pyelonephritis, subchorionic hemorrhage ____________________________________________   LABS (all labs ordered are listed, but only abnormal results are displayed)  Labs Reviewed  HCG, QUANTITATIVE, PREGNANCY - Abnormal; Notable for the following components:      Result Value   hCG, Beta Chain, Quant, S 45,174 (*)    All other components within normal limits  URINALYSIS, COMPLETE (UACMP) WITH MICROSCOPIC - Abnormal; Notable for the following components:   Color, Urine STRAW (*)    APPearance CLEAR (*)    Hgb urine dipstick LARGE (*)    Leukocytes, UA MODERATE (*)    Squamous Epithelial / LPF 0-5 (*)    All other components within normal limits  POCT PREGNANCY, URINE - Abnormal; Notable for the following components:   Preg Test, Ur POSITIVE (*)    All other components within normal limits  URINE CULTURE    Lab work reviewed by me consistent with urinary tract infection __________________________________________  EKG   ____________________________________________  RADIOLOGY  OB ultrasound reviewed by me with no acute disease ____________________________________________   PROCEDURES  Procedure(s) performed: no  Procedures  Critical Care performed: no  Observation: no ____________________________________________   INITIAL IMPRESSION / ASSESSMENT AND PLAN / ED COURSE  Pertinent labs & imaging results that were available during my care of the patient were reviewed by me and considered in my medical decision making (see chart for details).  The patient is hemodynamically stable and very well-appearing with a benign abdominal exam.  She has dysuria and hematuria consistent with urinary tract infection and no evidence of pyelonephritis.  Ultrasound is reassuring.  Given a first dose of Macrobid now.  I will cover her with a seven-day course and check urine culture.  Strict return precautions given the patient verbalizes understanding and agreement with the  plan.      ____________________________________________   FINAL CLINICAL IMPRESSION(S) / ED DIAGNOSES  Final diagnoses:  Lower urinary tract infectious disease      NEW MEDICATIONS STARTED DURING THIS VISIT:  New Prescriptions   NITROFURANTOIN, MACROCRYSTAL-MONOHYDRATE, (MACROBID) 100 MG CAPSULE    Take 1 capsule (100 mg total) by mouth 2 (two) times daily for 7 days.     Note:  This document was prepared using Dragon voice recognition software and may include unintentional dictation errors.     Merrily Brittle, MD 12/24/17 Izell Hershey

## 2017-12-24 NOTE — ED Notes (Signed)
NAD noted at time of D/C. Pt denies questions or concerns. Pt ambulatory to the lobby at this time. Marchelle FolksAmanda, Interpreter at bedside to review D/C instructions.

## 2017-12-24 NOTE — ED Notes (Signed)
ED Provider at bedside. 

## 2017-12-24 NOTE — ED Notes (Signed)
Pt offered interpreter for discharge. Pt agrees that she would like an interpreter for discharge information to be reviewed. Placing request now.

## 2017-12-24 NOTE — ED Triage Notes (Addendum)
Arrives with C/O lower abdominal pain x 15 minutes.  Patient is pregnant, due 06/08/18.  G5 P4.  [facility]  Patient also states she noticed some blood on the toilet tissue after voiding today.

## 2017-12-26 LAB — URINE CULTURE

## 2017-12-26 NOTE — ED Notes (Addendum)
12/26/2017 1229  Attempted to call pt @ [phone] about pos. urine culture. Left message to call back.

## 2017-12-27 NOTE — Progress Notes (Signed)
ED Antimicrobial Stewardship Positive Culture Follow Up   Evelina BucyLucia Sanchez Alba is an 37 y.o. female who [facility] with a chief complaint of  Chief Complaint  Patient presents with  . Abdominal Pain    Recent Results (from the past 720 hour(s))  Urine culture     Status: Abnormal   Collection Time: 12/24/17  4:46 PM  Result Value Ref Range Status   Specimen Description   Final    URINE, RANDOM Performed at [facility], [address] [place], [place] [address]    Special Requests   Final    NONE Performed at [facility], [address] [place], [place] [address]    Culture (A)  Final    30,000 COLONIES/mL GROUP B STREP(S.AGALACTIAE)ISOLATED CRITICAL RESULT CALLED TO, READ BACK BY AND VERIFIED WITH: SMITH RN  CHARGE NURSE AR ED AT 1100 ON [id number] BY SJW Performed at [facility], [address] [place], [place] [address]    Report Status 12/26/2017 FINAL  Final    [x]  Treated with nitrofurantoin, organism resistant to prescribed antimicrobial []  Patient discharged originally without antimicrobial agent and treatment is now indicated  New antibiotic prescription: amoxicillin 500mg  po tid x 5 days  ED Provider: Ileana RoupJames McShane MD    Gerre PebblesGarrett Brietta Manso 12/27/2017, 2:38 PM  Luan PullingGarrett Graviel Payeur, PharmD, MBA, BCGP Clinical Pharmacist [facility]

## 2018-04-12 ENCOUNTER — Other Ambulatory Visit: Payer: Self-pay | Admitting: Nurse Practitioner

## 2018-04-12 DIAGNOSIS — Z3689 Encounter for other specified antenatal screening: Secondary | ICD-10-CM

## 2018-04-25 ENCOUNTER — Ambulatory Visit: Payer: Self-pay

## 2018-04-25 DIAGNOSIS — I471 Supraventricular tachycardia, unspecified: Secondary | ICD-10-CM | POA: Insufficient documentation

## 2018-05-26 DIAGNOSIS — Z8759 Personal history of other complications of pregnancy, childbirth and the puerperium: Secondary | ICD-10-CM | POA: Insufficient documentation

## 2018-06-04 DIAGNOSIS — I82409 Acute embolism and thrombosis of unspecified deep veins of unspecified lower extremity: Secondary | ICD-10-CM | POA: Insufficient documentation

## 2019-08-15 IMAGING — US US OB LIMITED
1 series · 14 of 28 positions shown · non-contrast
Comparison: none

CLINICAL DATA: Sixteen weeks 2 days pregnant with vaginal spotting.

EXAM:
LIMITED OBSTETRIC ULTRASOUND

[Series 1: us ob limited · 0.23mm/px · 14 of 35 slices shown]
[im 2/35]
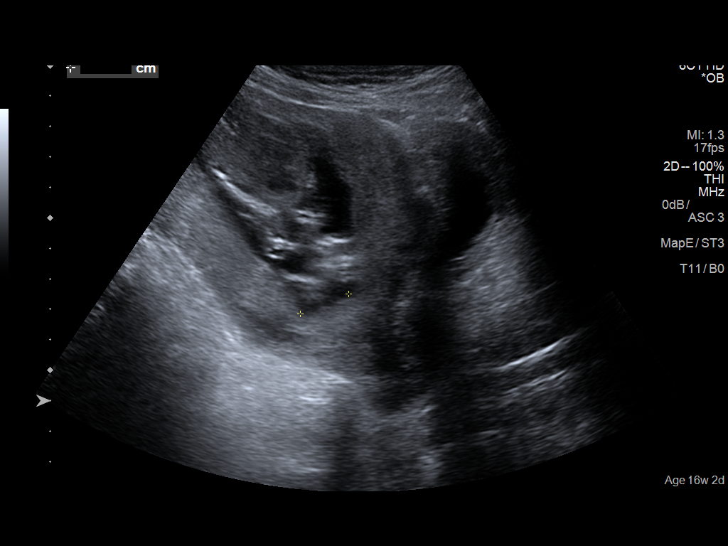
[im 4/35]
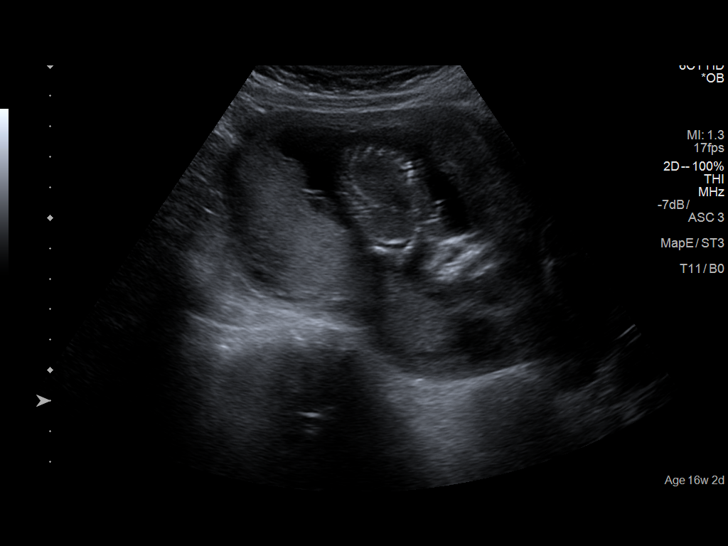
[im 7/35]
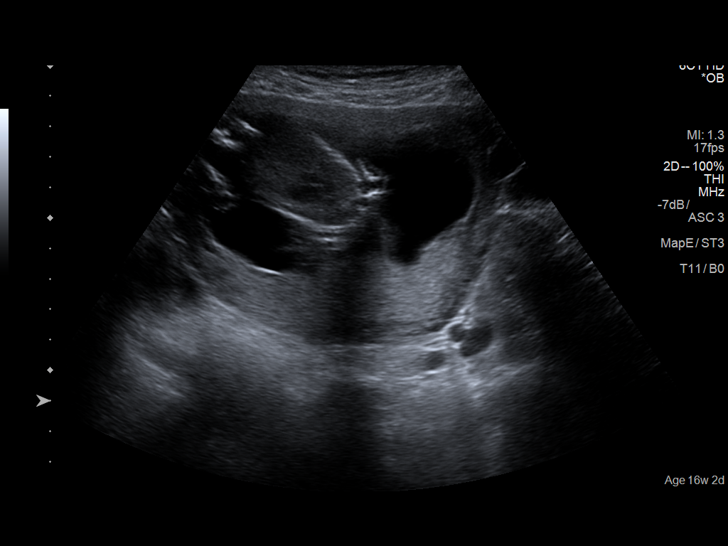
[im 9/35]
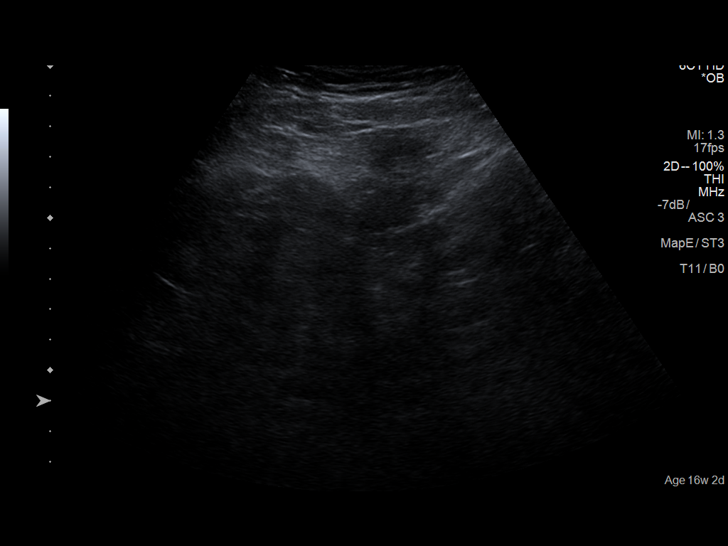
[im 12/35]
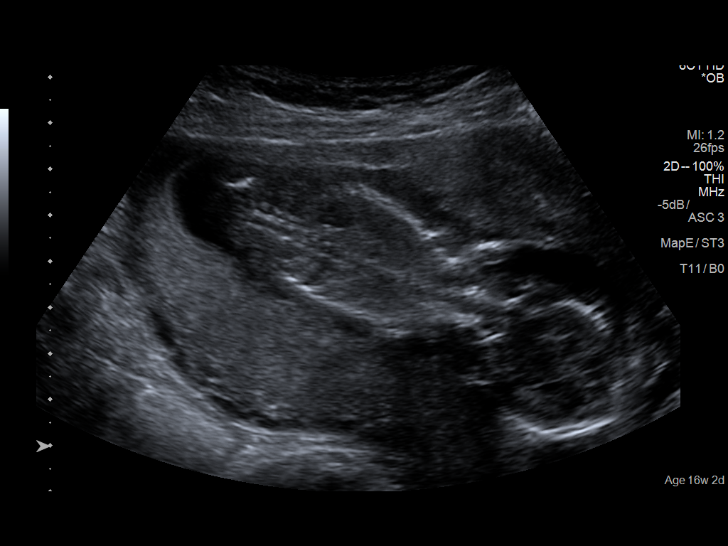
[im 14/35]
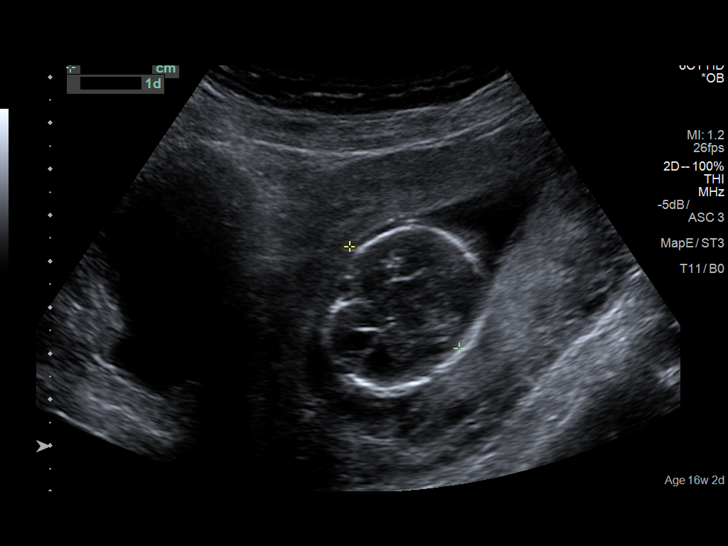
[im 17/35]
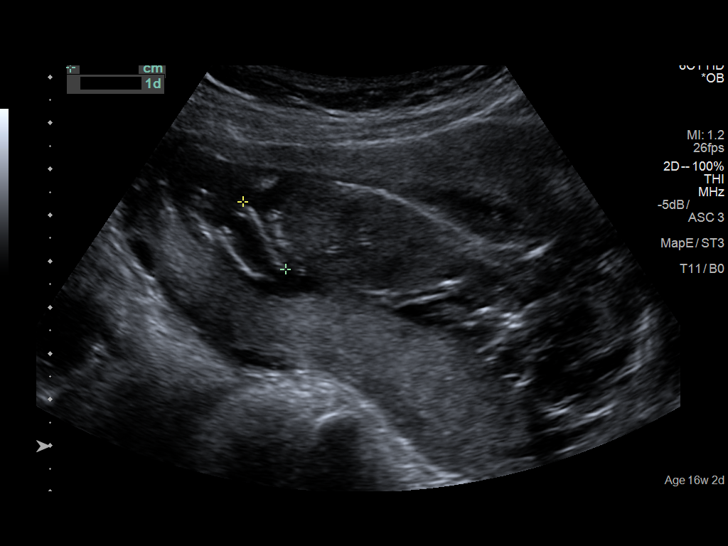
[im 19/35]
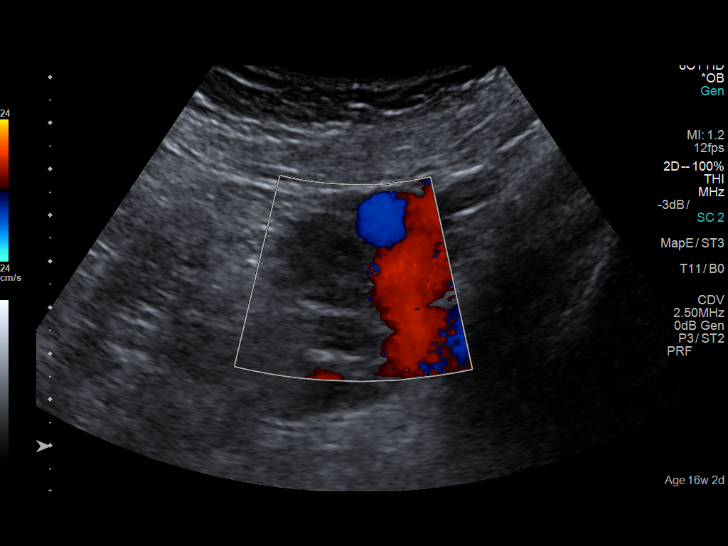
[im 22/35]
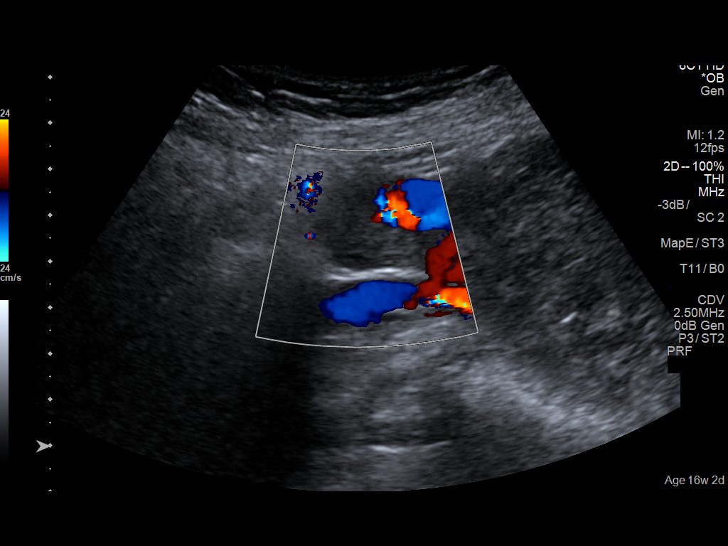
[im 24/35]
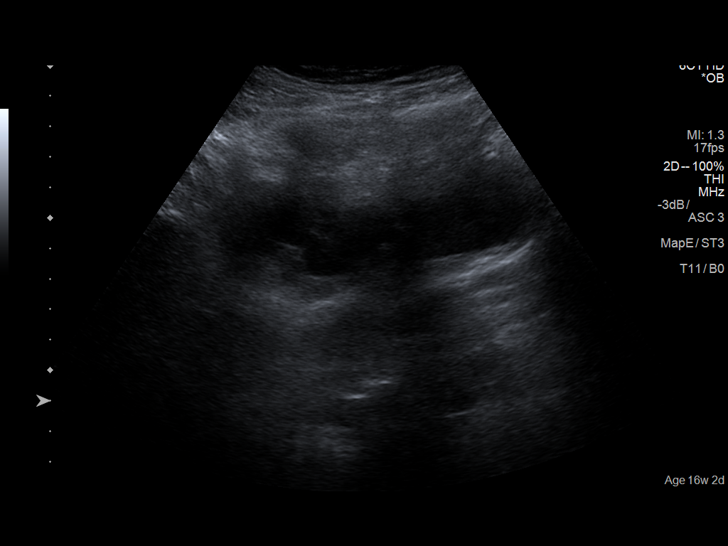
[im 27/35]
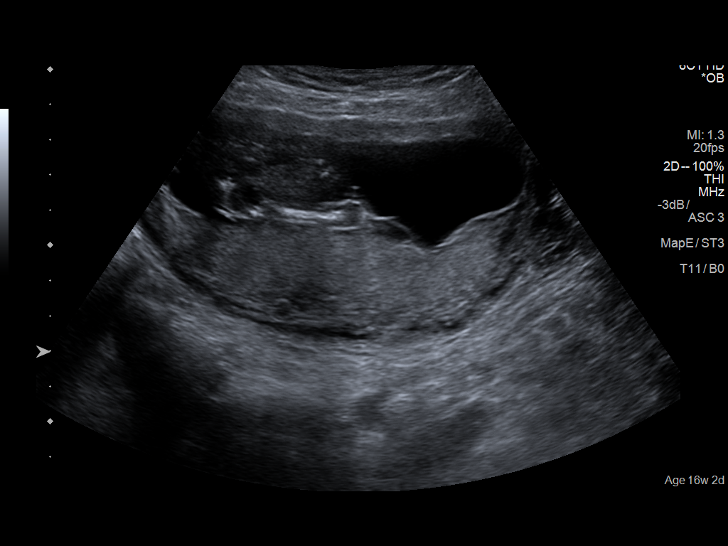
[im 29/35]
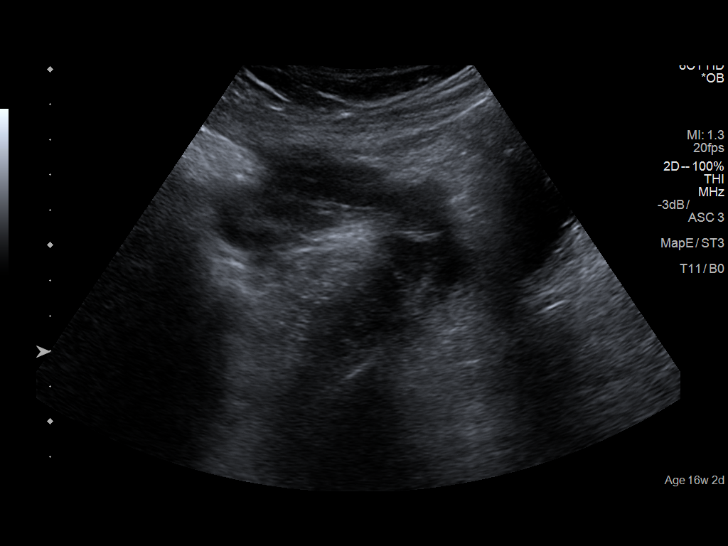
[im 32/35]
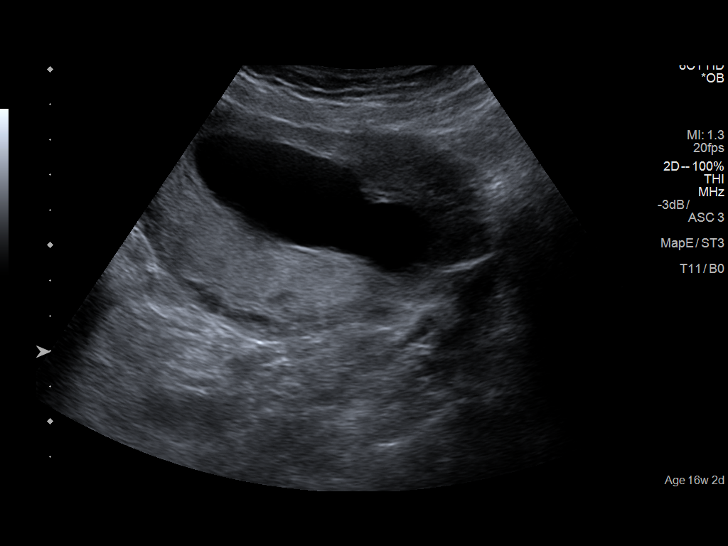
[im 35/35]
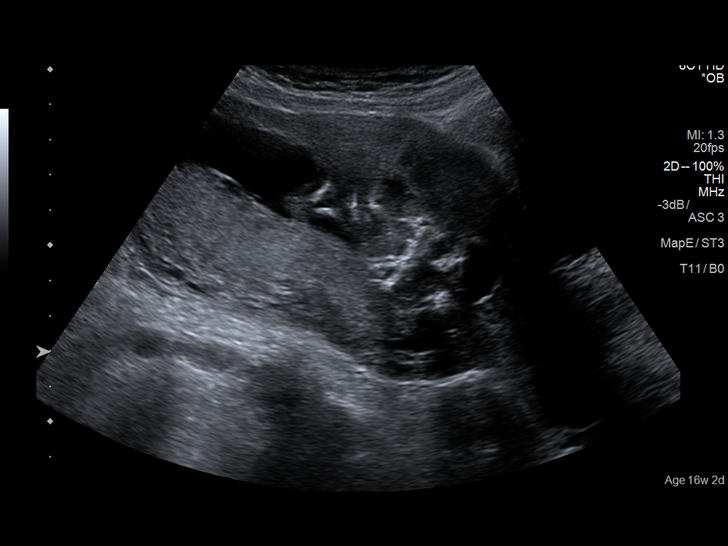

[14 of 28 positions shown; findings below may reference images not displayed]

FINDINGS: Number of Fetuses: 1

Heart Rate:  153 bpm

Movement: Yes

Presentation: Cephalic

Placental Location: Posterior

Previa: Low lying placenta.  1.7 cm from internal os.

Amniotic Fluid (Subjective):  Within normal limits.

BPD: 3.2 cm 15 w  6 d

MATERNAL FINDINGS:

Cervix:  Appears closed.-4.7 cm

Uterus/Adnexae: Normal.  No significant free fluid.
IMPRESSION: 1. Intrauterine pregnancy of approximately 15 weeks 6 days with
fetal heart rate of 153 beats per minute.
2. Posterior, low-lying placenta.

This exam is performed on an emergent basis and does not
comprehensively evaluate fetal size, dating, or anatomy; follow-up
complete OB US should be considered if further fetal assessment is
warranted.

## 2019-12-25 ENCOUNTER — Other Ambulatory Visit: Payer: Self-pay

## 2020-04-16 ENCOUNTER — Encounter: Payer: Self-pay | Admitting: Family Medicine

## 2020-04-16 ENCOUNTER — Ambulatory Visit (LOCAL_COMMUNITY_HEALTH_CENTER): Payer: Self-pay | Admitting: Family Medicine

## 2020-04-16 ENCOUNTER — Other Ambulatory Visit: Payer: Self-pay

## 2020-04-16 VITALS — BP 137/88 | Ht 62.4 in | Wt 114.8 lb

## 2020-04-16 DIAGNOSIS — Z3009 Encounter for other general counseling and advice on contraception: Secondary | ICD-10-CM

## 2020-04-16 DIAGNOSIS — Z01419 Encounter for gynecological examination (general) (routine) without abnormal findings: Secondary | ICD-10-CM

## 2020-04-16 MED ORDER — MULTI-VITAMIN/MINERALS PO TABS: 1.0000 | ORAL_TABLET | Freq: Every day | ORAL | 0 refills | Status: AC

## 2020-04-16 NOTE — Progress Notes (Signed)
Client required assistance completing health history form. Jossie Ng, RN

## 2020-04-16 NOTE — Progress Notes (Signed)
[facility] DEPARTMENT [facility] [address] Main Number: [phone]    Family Planning Visit- Initial Visit  Subjective:  Joy [name] is a 39 y.o.  [id number]   being seen today for an initial well woman visit and to discuss family planning options.  She is currently using Condoms for pregnancy prevention. Patient reports she does not want a pregnancy in the next year.  Patient has the following medical conditions has Leg DVT (deep venous thromboembolism), acute (HCC); SVT (supraventricular tachycardia) (HCC); History of postpartum hemorrhage; and Lactating mother on their problem list.  Chief Complaint  Patient presents with  . Annual Exam    Patient reports mild pain in ovaries, occasional. Associated with thin cervical mucous  Patient denies change to medical history  Body mass index is 20.73 kg/m. - Patient is eligible for diabetes screening based on BMI and age >64?  no HA1C ordered? not applicable  Patient reports 1 of partners in last year. Desires STI screening?  No   Has patient been screened once for HCV in the past?  No  No results found for: HCVAB  Does the patient have current drug use (including MJ), have a partner with drug use, and/or has been incarcerated since last result? No  If yes-- Screen for HCV through [facility]   Does the patient meet criteria for HBV testing? No  Criteria:  -Household, sexual or needle sharing contact with HBV -History of drug use -HIV positive -Those with known Hep C   Health Maintenance Due  Topic Date Due  . Hepatitis C Screening  Never done  . COVID-19 Vaccine (1) Never done  . PAP SMEAR-Modifier  08/10/2019  . INFLUENZA VACCINE  04/07/2020    ROS  The following portions of the patient's history were reviewed and updated as appropriate: allergies, current medications, past family history, past medical history, past social history, past surgical history and problem list.  Problem list updated.   See flowsheet for other program required questions.  Objective:   Vitals:   04/16/20 0845  BP: (!) 135/92  Weight: 114 lb 12.8 oz (52.1 kg)  Height: 5' 2.4" (1.585 m)   Patient's last menstrual period was 03/31/2020 (within days).   Physical Exam Exam conducted with a chaperone present.  Constitutional:      Appearance: Normal appearance.  HENT:     Head: Normocephalic and atraumatic.  Pulmonary:     Effort: Pulmonary effort is normal.  Chest:     Breasts: Tanner Score is 5. Breasts are symmetrical.        Right: Normal. No inverted nipple, mass or nipple discharge.        Left: Normal. No inverted nipple, mass or nipple discharge.  Abdominal:     Palpations: Abdomen is soft.  Genitourinary:    Exam position: Lithotomy position.     Pubic Area: No rash.      Labia:        Right: No rash.        Left: No rash.      Vagina: Normal.     Cervix: Normal.     Uterus: Normal.      Adnexa: Right adnexa normal and left adnexa normal.  Musculoskeletal:        General: Normal range of motion.  Skin:    General: Skin is warm and dry.  Neurological:     General: No focal deficit present.     Mental Status: She  is alert.  Psychiatric:        Mood and Affect: Mood normal.        Behavior: Behavior normal.       Assessment and Plan:  Joy [name] is a 39 y.o. female presenting to the [facility] Department for an initial well woman exam/family planning visit  Contraception counseling: Reviewed all forms of birth control options in the tiered based approach. available including abstinence; over the counter/barrier methods; hormonal contraceptive medication including pill, patch, ring, injection,contraceptive implant, ECP; hormonal and nonhormonal IUDs; permanent sterilization options including vasectomy and the various tubal sterilization modalities. Risks, benefits, and typical effectiveness rates were reviewed.  Questions were  answered.  Written information was also given to the patient to review.  Patient desires condoms and NFP, this was prescribed for patient. She will follow up in  1 yr for surveillance.  She was told to call with any further questions, or with any concerns about this method of contraception.  Emphasized use of condoms 100% of the time for STI prevention.   1. Well woman exam with routine gynecological exam - BP initially increased but improved with recheck - Encouraged PCP care - IGP, Aptima HPV -Td up to date - Offered COVID 19 vaccine today, declines today but will return with other children to get. Has appropriated questions about her risk because she had a DVT after birth of last child but not on any anticoagulation. We discussed breastfeeding safety and vaccine as well.  - CBE performed - No family risk factors for early breast cancer, discussed mammograms starting at 14 through [facility] - Continuing to nurse 39yo - counseled on normality of this and protection against breast cancer. Reports history of inverted nipple on the left but nursing fine and on exam both are everted. Consider mammogram after weaning   2. Encounter for other general counseling or advice on contraception - using condoms - IGP, Aptima HPV   Return in about 1 year (around 04/16/2021) for Yearly wellness exam.  No future appointments.  Federico Flake, MD

## 2020-04-16 NOTE — Addendum Note (Signed)
Addended by: Jossie Ng on: 04/16/2020 10:14 AM   Modules accepted: Orders

## 2020-04-20 LAB — IGP, APTIMA HPV
HPV Aptima: NEGATIVE
PAP Smear Comment: 0

## 2020-04-23 LAB — HM HEPATITIS C SCREENING LAB: HM Hepatitis Screen: NEGATIVE

## 2020-04-24 ENCOUNTER — Encounter: Payer: Self-pay | Admitting: Family Medicine

## 2021-06-24 ENCOUNTER — Ambulatory Visit: Payer: Self-pay

## 2021-07-01 ENCOUNTER — Ambulatory Visit: Payer: Self-pay

## 2021-07-07 ENCOUNTER — Other Ambulatory Visit: Payer: Self-pay

## 2021-07-07 ENCOUNTER — Encounter: Payer: Self-pay | Admitting: Physician Assistant

## 2021-07-07 ENCOUNTER — Ambulatory Visit (LOCAL_COMMUNITY_HEALTH_CENTER): Payer: Self-pay | Admitting: Physician Assistant

## 2021-07-07 VITALS — BP 133/88 | Ht 60.0 in | Wt 103.6 lb

## 2021-07-07 DIAGNOSIS — Z0000 Encounter for general adult medical examination without abnormal findings: Secondary | ICD-10-CM

## 2021-07-07 DIAGNOSIS — Z3049 Encounter for surveillance of other contraceptives: Secondary | ICD-10-CM

## 2021-07-07 DIAGNOSIS — Z3009 Encounter for other general counseling and advice on contraception: Secondary | ICD-10-CM

## 2021-07-07 NOTE — Progress Notes (Signed)
Pt here for PE and questions regarding mammograms.  Pt uses condoms for birth control and does not want to discuss other options.  Berdie Ogren, RN

## 2021-07-08 ENCOUNTER — Encounter: Payer: Self-pay | Admitting: Physician Assistant

## 2021-07-08 NOTE — Progress Notes (Signed)
Family Planning Visit- Repeat Yearly Visit  Subjective:  Almetta Liddicoat is a 40 y.o. [phone]  being seen today for an annual wellness visit and to discuss contraception options.   The patient is currently using Condoms for pregnancy prevention. Patient does not want a pregnancy in the next year. Patient has the following medical problems: has Leg DVT (deep venous thromboembolism), acute (HCC); SVT (supraventricular tachycardia) (HCC); History of postpartum hemorrhage; and Lactating mother on their problem list.  Chief Complaint  Patient presents with   Contraception    Annual exam    Patient reports that she is here for her annual physical today.  Patient states that she is using condoms as her [facility] and desires to continue with this as her BCM.  Patient reports that she is still breast feeding her 40 yo and is also interested in getting a mammogram.  Reports that she has noticed that she is bruising easily on her legs but is not sure why.  Reports that she feels she has lost weight, though she is not checking regularly.  States that she is not taking any MVI and is eating 2 times daily.  Reports that she has a headache in the frontal area "always", and states that they are related to stress. Patient states that she checks her BP at home and made adjustments to her diet by lowering sodium/salt and that her BP has been in the normal range since making this change. Per chart review, CBE is due annually and pap is due in 2026.  Patient denies any other concerns.    See flowsheet for other program required questions.   Body mass index is 20.23 kg/m. - Patient is eligible for diabetes screening based on BMI and age >63?  not applicable HA1C ordered? not applicable  Patient reports 1 of partners in last year. Desires STI screening?  No - patient declines.   Has patient been screened once for HCV in the past?  No  No results found for: HCVAB  Does the patient have current of drug use, have a  partner with drug use, and/or has been incarcerated since last result? No  If yes-- Screen for HCV through [facility]   Does the patient meet criteria for HBV testing? No  Criteria:  -Household, sexual or needle sharing contact with HBV -History of drug use -HIV positive -Those with known Hep C   Health Maintenance Due  Topic Date Due   INFLUENZA VACCINE  04/07/2021    Review of Systems  All other systems reviewed and are negative.  The following portions of the patient's history were reviewed and updated as appropriate: allergies, current medications, past family history, past medical history, past social history, past surgical history and problem list. Problem list updated.  Objective:   Vitals:   07/07/21 0927 07/07/21 0939  BP: (!) 144/87 133/88  Weight: 103 lb 9.6 oz (47 kg)   Height: 5' (1.524 m)     Physical Exam Vitals and nursing note reviewed.  Constitutional:      General: She is not in acute distress.    Appearance: Normal appearance.  HENT:     Head: Normocephalic and atraumatic.     Mouth/Throat:     Mouth: Mucous membranes are moist.     Pharynx: Oropharynx is clear. No oropharyngeal exudate or posterior oropharyngeal erythema.  Eyes:     Conjunctiva/sclera: Conjunctivae normal.  Neck:     Thyroid: No thyroid mass, thyromegaly or thyroid tenderness.  Cardiovascular:     Rate and Rhythm: Normal rate and regular rhythm.  Pulmonary:     Effort: Pulmonary effort is normal.     Breath sounds: Normal breath sounds.  Chest:  Breasts:    Right: Normal. No mass, nipple discharge, skin change or tenderness.     Left: Normal. No mass, nipple discharge, skin change or tenderness.  Abdominal:     Palpations: Abdomen is soft. There is no mass.     Tenderness: There is no abdominal tenderness. There is no guarding or rebound.  Musculoskeletal:     Cervical back: Neck supple. No tenderness.  Lymphadenopathy:     Cervical: No cervical adenopathy.     Upper  Body:     Right upper body: No supraclavicular, axillary or pectoral adenopathy.     Left upper body: No supraclavicular, axillary or pectoral adenopathy.  Skin:    General: Skin is warm and dry.     Findings: No bruising, erythema, lesion or rash.  Neurological:     Mental Status: She is alert and oriented to person, place, and time.  Psychiatric:        Mood and Affect: Mood normal.        Behavior: Behavior normal.        Thought Content: Thought content normal.        Judgment: Judgment normal.      Assessment and Plan:  Phila Shoaf is a 40 y.o. female [phone] presenting to the [facility] Department for an yearly wellness and contraception visit  Contraception counseling: Reviewed all forms of birth control options in the tiered based approach. available including abstinence; over the counter/barrier methods; hormonal contraceptive medication including pill, patch, ring, injection,contraceptive implant, ECP; hormonal and nonhormonal IUDs; permanent sterilization options including vasectomy and the various tubal sterilization modalities. Risks, benefits, and typical effectiveness rates were reviewed.  Questions were answered.  Written information was also given to the patient to review.  Patient desires to continue with condoms, this was prescribed for patient. She will follow up in  1 year and prn for surveillance.  She was told to call with any further questions, or with any concerns about this method of contraception.  Emphasized use of condoms 100% of the time for STI prevention.  Patient was not a candidate for ECP today.   1. Encounter for counseling regarding contraception Reviewed with patient as above re: BCM options. Reviewed with patient when to call clinic with concerns. Enc condoms with all sex for STD protection.   2. Well woman exam (no gynecological exam) Reviewed with patient healthy habits to maintain general health. Enc MVI 1 po  daily. Stressed to patient importance of MVI and regular meals for self and child that she is still breast feeding. Patient requests info on B[facility]CP to schedule for mammogram despite counseling that the recommendations are to start at 41 yo if low risk. Enc to follow up with PCP regarding bruising, BP, monitoring blood sugar due to history of GDM, and headaches. Enc to establish with/ follow up with PCP for primary care concerns, age appropriate screenings and illness.   3. Surveillance for contraception barrier or spermicide Continue with condoms always for [facility] and counseled that she can add OTC spermicide for greater effectiveness.     Return in about 1 year (around 07/07/2022) for RP and prn.  No future appointments.  Matt Holmes, PA

## 2022-03-05 ENCOUNTER — Ambulatory Visit (LOCAL_COMMUNITY_HEALTH_CENTER): Payer: Self-pay | Admitting: Advanced Practice Midwife

## 2022-03-05 VITALS — BP 133/85 | Ht 60.0 in | Wt 109.4 lb

## 2022-03-05 DIAGNOSIS — N6310 Unspecified lump in the right breast, unspecified quadrant: Secondary | ICD-10-CM | POA: Insufficient documentation

## 2022-03-05 DIAGNOSIS — N6312 Unspecified lump in the right breast, upper inner quadrant: Secondary | ICD-10-CM

## 2022-03-05 DIAGNOSIS — Z1239 Encounter for other screening for malignant neoplasm of breast: Secondary | ICD-10-CM

## 2022-03-05 DIAGNOSIS — Z3009 Encounter for other general counseling and advice on contraception: Secondary | ICD-10-CM

## 2022-03-05 NOTE — Progress Notes (Signed)
41 yo SHF G6 P5 (16,15,10,6,3) nonsmoker presents with c/o right breast mass originally painful and firm onset 3 wks ago and in past week nontender. Lactating and nursing 41 yo only at night. LMP 02/16/22. Last sex 03/02/22 with condom. 2 breast masses palpated in right breast: 3x1" at 3:00 position of right breast and another 6x4.5" at 3:00 position from border of areola extending laterally towards sternum, firm, nontender.  Referred to [facility] and Joellyn Quails.

## 2022-03-05 NOTE — Progress Notes (Signed)
Patient here for an acute FP visit. Condoms given. BCCP information given to patient.

## 2022-03-16 ENCOUNTER — Other Ambulatory Visit: Payer: Self-pay

## 2022-03-16 DIAGNOSIS — N6310 Unspecified lump in the right breast, unspecified quadrant: Secondary | ICD-10-CM

## 2022-03-18 ENCOUNTER — Ambulatory Visit: Payer: Self-pay | Attending: Hematology and Oncology | Admitting: *Deleted

## 2022-03-18 VITALS — BP 148/87 | Wt 111.0 lb

## 2022-03-18 DIAGNOSIS — N6315 Unspecified lump in the right breast, overlapping quadrants: Secondary | ICD-10-CM

## 2022-03-18 DIAGNOSIS — Z1239 Encounter for other screening for malignant neoplasm of breast: Secondary | ICD-10-CM

## 2022-03-18 NOTE — Patient Instructions (Signed)
Explained breast self awareness with Joy [name]. Patient did not need a Pap smear today due to last Pap smear and HPV typing was 04/16/2020. Let her know [facility] will cover Pap smears and HPV typing every 5 years unless has a history of abnormal Pap smears. Referred patient to the [facility] for a diagnostic mammogram. Appointment scheduled Friday, April 03, 2022 at 1340. Patient aware of appointment and will be there.  Joy [name] verbalized understanding.  Jerene Yeager, Kathaleen Maser, RN 10:32 AM

## 2022-03-18 NOTE — Progress Notes (Signed)
[name] is a 41 y.o. female who presents to [facility] clinic today with complaint of right breast lump x one month that was painful for prior to palpating lump and the pain resolved once she felt the lump. Patient is currently breastfeeding her 85-year old.    Pap Smear: Pap smear not completed today. Last Pap smear was 04/16/2020 at the [facility] Department clinic and was normal with negative HPV. Per patient has no history of an abnormal Pap smear. Last Pap smear result is available in Epic.   Physical exam: Breasts Breasts symmetrical. No skin abnormalities bilateral breasts. No nipple retraction bilateral breasts. Patient is currently breastfeeding. No lymphadenopathy. No lumps palpated left breast. Palpated a lump within the right breast at 3 o'clock 6.5 cm from the nipple. No complaints of pain or tenderness on exam.  Pelvic/Bimanual Pap is not indicated today per [facility]CP guidelines.   Smoking History: Patient has never smoked.   Patient Navigation: Patient education provided. Access to services provided for patient through [facility] program. Spanish interpreter Joy [name] [phone] on [place] provided.    Breast and Cervical Cancer Risk Assessment: Patient does not have family history of breast cancer, known genetic mutations, or radiation treatment to the chest before age 51. Patient does not have history of cervical dysplasia, immunocompromised, or DES exposure in-utero.  Risk Assessment     Risk Scores       03/18/2022   Last edited by: Narda Rutherford, LPN   5-year risk: 0.4 %   Lifetime risk: 5.7 %            A: [facility]CP exam without pap smear Complaint of right breast lump.  P: Referred patient to the [facility] for a diagnostic mammogram. Appointment scheduled Friday, April 03, 2022 at 1340.  Joy Heidelberg, RN 03/18/2022 10:32 AM

## 2022-04-03 ENCOUNTER — Ambulatory Visit
Admission: RE | Admit: 2022-04-03 | Discharge: 2022-04-03 | Disposition: A | Discharge status: Home / Self Care | Payer: Self-pay | Source: Ambulatory Visit | Attending: Obstetrics and Gynecology | Admitting: Obstetrics and Gynecology

## 2022-04-03 DIAGNOSIS — N6310 Unspecified lump in the right breast, unspecified quadrant: Secondary | ICD-10-CM

## 2022-04-06 ENCOUNTER — Other Ambulatory Visit: Payer: Self-pay | Admitting: Obstetrics and Gynecology

## 2022-04-06 DIAGNOSIS — N63 Unspecified lump in unspecified breast: Secondary | ICD-10-CM

## 2022-04-06 DIAGNOSIS — R928 Other abnormal and inconclusive findings on diagnostic imaging of breast: Secondary | ICD-10-CM

## 2022-04-17 ENCOUNTER — Ambulatory Visit
Admission: RE | Admit: 2022-04-17 | Discharge: 2022-04-17 | Disposition: A | Discharge status: Home / Self Care | Payer: Self-pay | Source: Ambulatory Visit | Attending: Obstetrics and Gynecology | Admitting: Obstetrics and Gynecology

## 2022-04-17 DIAGNOSIS — N63 Unspecified lump in unspecified breast: Secondary | ICD-10-CM

## 2022-04-17 DIAGNOSIS — R928 Other abnormal and inconclusive findings on diagnostic imaging of breast: Secondary | ICD-10-CM | POA: Insufficient documentation

## 2022-04-17 HISTORY — PX: BREAST BIOPSY: SHX20

## 2022-04-20 LAB — SURGICAL PATHOLOGY

## 2022-08-12 ENCOUNTER — Other Ambulatory Visit: Payer: Self-pay

## 2022-08-12 DIAGNOSIS — N6315 Unspecified lump in the right breast, overlapping quadrants: Secondary | ICD-10-CM

## 2022-10-19 ENCOUNTER — Ambulatory Visit
Admission: RE | Admit: 2022-10-19 | Discharge: 2022-10-19 | Disposition: A | Discharge status: Home / Self Care | Payer: Self-pay | Source: Ambulatory Visit | Attending: Obstetrics and Gynecology | Admitting: Obstetrics and Gynecology

## 2022-10-19 DIAGNOSIS — N6315 Unspecified lump in the right breast, overlapping quadrants: Secondary | ICD-10-CM | POA: Insufficient documentation

## 2023-01-26 ENCOUNTER — Other Ambulatory Visit: Payer: Self-pay

## 2023-01-26 DIAGNOSIS — Z1231 Encounter for screening mammogram for malignant neoplasm of breast: Secondary | ICD-10-CM

## 2023-04-05 ENCOUNTER — Ambulatory Visit: Payer: Self-pay | Attending: Hematology and Oncology | Admitting: Hematology and Oncology

## 2023-04-05 ENCOUNTER — Ambulatory Visit
Admission: RE | Admit: 2023-04-05 | Discharge: 2023-04-05 | Disposition: A | Discharge status: Home / Self Care | Payer: Self-pay | Source: Ambulatory Visit | Attending: Obstetrics and Gynecology | Admitting: Obstetrics and Gynecology

## 2023-04-05 VITALS — BP 154/99 | Wt 114.3 lb

## 2023-04-05 DIAGNOSIS — Z1231 Encounter for screening mammogram for malignant neoplasm of breast: Secondary | ICD-10-CM

## 2023-04-05 NOTE — Progress Notes (Signed)
[name] is a 42 y.o. female who presents to [facility] clinic today with no complaints.    Pap Smear: Pap not smear completed today. Last Pap smear was 04/16/2020 at [facility] clinic and was normal. Per patient has no history of an abnormal Pap smear. Last Pap smear result is available in Epic.   Physical exam: Breasts Breasts symmetrical. No skin abnormalities bilateral breasts. No nipple retraction bilateral breasts. No nipple discharge bilateral breasts. No lymphadenopathy. No lumps palpated bilateral breasts.  MS CLIP PLACEMENT RIGHT  Result Date: 04/17/2022 CLINICAL DATA:  Patient is post ultrasound-guided core needle biopsy of a 1.9 cm intraductal mass over the 3 o'clock position of the right breast 7 cm from the nipple. EXAM: DIAGNOSTIC RIGHT MAMMOGRAM POST ULTRASOUND BIOPSY COMPARISON:  Previous exam(s). FINDINGS: Mammographic images were obtained following ultrasound guided biopsy of the targeted mass over the 3 o'clock position of the right breast. The biopsy marking clip is in expected position at the site of biopsy. IMPRESSION: Appropriate positioning of the ribbon shaped biopsy marking clip at the site of biopsy in the 3 o'clock position of the right breast. Final Assessment: Post Procedure Mammograms for Marker Placement Electronically Signed   By: Elberta Fortis M.D.   On: 04/17/2022 08:54  MS DIGITAL DIAG TOMO BILAT  Result Date: 04/03/2022 CLINICAL DATA:  42 year old female presenting with a new lump in the right breast for approximately 1 month. Patient is currently breast feeding her 103-year-old child. EXAM: DIGITAL DIAGNOSTIC BILATERAL MAMMOGRAM WITH TOMOSYNTHESIS; ULTRASOUND RIGHT BREAST LIMITED TECHNIQUE: Bilateral digital diagnostic mammography and breast tomosynthesis was performed.; Targeted ultrasound examination of the right breast was performed COMPARISON:  None available. ACR Breast Density Category c: The breast tissue is heterogeneously dense, which may obscure small  masses. FINDINGS: Mammogram: Right breast: A skin BB marks the palpable site of concern reported by the patient in the medial right breast. A spot tangential view of this area was performed in addition to standard views. At the palpable site there is a superficial oval circumscribed mass measuring approximately 1.1 cm. No new findings elsewhere in the right breast. Left breast: No suspicious mass, distortion, or microcalcifications are identified to suggest presence of malignancy. On physical exam at the site of concern reported by the patient in the medial right breast I feel a discrete superficial mass. Ultrasound: Targeted ultrasound is performed in the right breast at 3 o'clock 7 cm from the nipple at the palpable site of concern demonstrating a dilated duct with possible intraductal mass versus inspissated secretions. There is internal vascularity. The duct extends towards the nipple with a similar appearance at the 3 o'clock position 2 cm from the nipple. However this area does not show any internal vascularity. Targeted ultrasound of the right axilla demonstrates normal lymph nodes. IMPRESSION: Indeterminate dilated duct with intraductal mass versus inspissated secretions at 3 o'clock 7 cm from the nipple in the right breast at the palpable site of concern. RECOMMENDATION: Ultrasound-guided core needle biopsy of the right breast possible intraductal mass at 3 o'clock 7 cm from the nipple. If this area or turns as atypia or malignancy recommend additional sampling of this duct closer to the to the nipple for extent of disease. I have discussed the findings and recommendations with the patient. The patient will be contacted by our scheduler to arrange the biopsy appointment. BI-RADS CATEGORY  4: Suspicious. Electronically Signed   By: Emmaline Kluver M.D.   On: 04/03/2022 14:57  Pelvic/Bimanual Pap is not indicated today    Smoking History: Patient has never smoked and was not referred to quit  line.    Patient Navigation: Patient education provided. Access to services provided for patient through [facility] program. Delos Haring interpreter provided. No transportation provided   Colorectal Cancer Screening: Per patient has never had colonoscopy completed No complaints today.    Breast and Cervical Cancer Risk Assessment: Patient does not have family history of breast cancer, known genetic mutations, or radiation treatment to the chest before age 20. Patient does not have history of cervical dysplasia, immunocompromised, or DES exposure in-utero.  Risk Assessment   No risk assessment data for the current encounter  Risk Scores       03/18/2022   Last edited by: Narda Rutherford, LPN   5-year risk: 0.4%   Lifetime risk: 5.7%            A: B[facility]CP exam without pap smear No complaints with benign exam.   P: Referred patient to the [facility] for a screening mammogram. Appointment scheduled 04/05/23.  Ilda Basset A, NP 04/05/2023 3:24 PM

## 2023-04-05 NOTE — Patient Instructions (Signed)
Taught Joy [name] about self breast awareness and gave educational materials to take home. Patient did not need a Pap smear today due to last Pap smear was in 04/16/2020 per patient. Let her know [facility] will cover Pap smears every 5 years unless has a history of abnormal Pap smears. Referred patient to the [facility] for screening mammogram. Appointment scheduled for 04/05/23. Patient aware of appointment and will be there. Let patient know will follow up with her within the next couple weeks with results. [name] verbalized understanding.  Pascal Lux, NP 3:48 PM
